# Patient Record
Sex: Female | Born: 1965 | Race: Black or African American | Hispanic: No | Smoking: Current every day smoker
Health system: Southern US, Community
[De-identification: ages and names within clinical notes are randomized; demographics above are authoritative.]

## PROBLEM LIST (undated history)

## (undated) DIAGNOSIS — F319 Bipolar disorder, unspecified: Secondary | ICD-10-CM

## (undated) DIAGNOSIS — F32A Depression, unspecified: Secondary | ICD-10-CM

## (undated) DIAGNOSIS — M549 Dorsalgia, unspecified: Secondary | ICD-10-CM

## (undated) DIAGNOSIS — F329 Major depressive disorder, single episode, unspecified: Secondary | ICD-10-CM

## (undated) DIAGNOSIS — F419 Anxiety disorder, unspecified: Secondary | ICD-10-CM

## (undated) HISTORY — PX: NECK SURGERY: SHX720

---

## 1999-12-07 ENCOUNTER — Other Ambulatory Visit: Admission: RE | Admit: 1999-12-07 | Discharge: 1999-12-07 | Payer: Self-pay | Admitting: Family Medicine

## 2001-05-25 ENCOUNTER — Emergency Department (HOSPITAL_COMMUNITY): Admission: EM | Admit: 2001-05-25 | Discharge: 2001-05-25 | Payer: Self-pay | Admitting: Emergency Medicine

## 2001-07-10 ENCOUNTER — Encounter: Payer: Self-pay | Admitting: Emergency Medicine

## 2001-07-10 ENCOUNTER — Emergency Department (HOSPITAL_COMMUNITY): Admission: EM | Admit: 2001-07-10 | Discharge: 2001-07-10 | Payer: Self-pay | Admitting: Emergency Medicine

## 2001-11-04 ENCOUNTER — Emergency Department (HOSPITAL_COMMUNITY): Admission: EM | Admit: 2001-11-04 | Discharge: 2001-11-04 | Payer: Self-pay

## 2002-07-31 ENCOUNTER — Emergency Department (HOSPITAL_COMMUNITY): Admission: EM | Admit: 2002-07-31 | Discharge: 2002-08-01 | Payer: Self-pay | Admitting: Emergency Medicine

## 2002-08-01 ENCOUNTER — Emergency Department (HOSPITAL_COMMUNITY): Admission: EM | Admit: 2002-08-01 | Discharge: 2002-08-01 | Payer: Self-pay | Admitting: Emergency Medicine

## 2002-12-07 ENCOUNTER — Emergency Department (HOSPITAL_COMMUNITY): Admission: EM | Admit: 2002-12-07 | Discharge: 2002-12-07 | Payer: Self-pay | Admitting: *Deleted

## 2003-07-22 ENCOUNTER — Encounter
Admission: RE | Admit: 2003-07-22 | Discharge: 2003-10-20 | Payer: Self-pay | Admitting: Physical Medicine & Rehabilitation

## 2003-09-20 ENCOUNTER — Emergency Department (HOSPITAL_COMMUNITY): Admission: EM | Admit: 2003-09-20 | Discharge: 2003-09-20 | Payer: Self-pay | Admitting: Emergency Medicine

## 2003-09-29 ENCOUNTER — Emergency Department (HOSPITAL_COMMUNITY): Admission: EM | Admit: 2003-09-29 | Discharge: 2003-09-29 | Payer: Self-pay | Admitting: Emergency Medicine

## 2003-11-03 ENCOUNTER — Observation Stay (HOSPITAL_COMMUNITY): Admission: EM | Admit: 2003-11-03 | Discharge: 2003-11-04 | Payer: Self-pay | Admitting: Internal Medicine

## 2004-01-29 ENCOUNTER — Emergency Department (HOSPITAL_COMMUNITY): Admission: EM | Admit: 2004-01-29 | Discharge: 2004-01-29 | Payer: Self-pay | Admitting: Emergency Medicine

## 2004-02-14 ENCOUNTER — Emergency Department (HOSPITAL_COMMUNITY): Admission: EM | Admit: 2004-02-14 | Discharge: 2004-02-14 | Payer: Self-pay | Admitting: Emergency Medicine

## 2004-02-20 ENCOUNTER — Emergency Department (HOSPITAL_COMMUNITY): Admission: EM | Admit: 2004-02-20 | Discharge: 2004-02-21 | Payer: Self-pay | Admitting: Emergency Medicine

## 2004-02-23 ENCOUNTER — Emergency Department (HOSPITAL_COMMUNITY): Admission: EM | Admit: 2004-02-23 | Discharge: 2004-02-23 | Payer: Self-pay | Admitting: Emergency Medicine

## 2004-03-15 ENCOUNTER — Encounter: Admission: RE | Admit: 2004-03-15 | Discharge: 2004-03-15 | Payer: Self-pay | Admitting: Orthopedic Surgery

## 2004-03-19 ENCOUNTER — Emergency Department (HOSPITAL_COMMUNITY): Admission: EM | Admit: 2004-03-19 | Discharge: 2004-03-19 | Payer: Self-pay | Admitting: Emergency Medicine

## 2004-03-23 ENCOUNTER — Emergency Department (HOSPITAL_COMMUNITY): Admission: EM | Admit: 2004-03-23 | Discharge: 2004-03-24 | Payer: Self-pay | Admitting: Emergency Medicine

## 2004-04-02 ENCOUNTER — Emergency Department (HOSPITAL_COMMUNITY): Admission: EM | Admit: 2004-04-02 | Discharge: 2004-04-02 | Payer: Self-pay | Admitting: Emergency Medicine

## 2004-04-21 ENCOUNTER — Emergency Department (HOSPITAL_COMMUNITY): Admission: EM | Admit: 2004-04-21 | Discharge: 2004-04-21 | Payer: Self-pay | Admitting: Emergency Medicine

## 2004-04-23 ENCOUNTER — Emergency Department (HOSPITAL_COMMUNITY): Admission: EM | Admit: 2004-04-23 | Discharge: 2004-04-23 | Payer: Self-pay | Admitting: Emergency Medicine

## 2004-04-28 ENCOUNTER — Emergency Department (HOSPITAL_COMMUNITY): Admission: EM | Admit: 2004-04-28 | Discharge: 2004-04-28 | Payer: Self-pay

## 2004-05-02 ENCOUNTER — Emergency Department (HOSPITAL_COMMUNITY): Admission: EM | Admit: 2004-05-02 | Discharge: 2004-05-02 | Payer: Self-pay | Admitting: Emergency Medicine

## 2004-05-19 ENCOUNTER — Emergency Department (HOSPITAL_COMMUNITY): Admission: EM | Admit: 2004-05-19 | Discharge: 2004-05-19 | Payer: Self-pay | Admitting: Emergency Medicine

## 2004-05-22 ENCOUNTER — Emergency Department (HOSPITAL_COMMUNITY): Admission: EM | Admit: 2004-05-22 | Discharge: 2004-05-22 | Payer: Self-pay

## 2004-05-23 IMAGING — CR DG CHEST 2V
2 series · 2 of 2 positions shown · non-contrast
Comparison: none

CLINICAL DATA: Chest wall pain.  History of asthma.
 CHEST X-RAY
 Two views of the chest are compared to a chest x-ray of 02/23/04.  The lungs are not as well aerated and there is basilar atelectasis.  In addition, there is an opacity at the mid right lung base which is new.  Pneumonia is a definite consideration and follow up chest x-ray to insure clearing is recommended.  On the lateral view, this opacity most likely is in the right lower lobe posteriorly. 
 IMPRESSION
 New opacity at the right lung base, probably in the right lower lobe, most consistent with pneumonia.  Basilar atelectasis.

[view not recorded (1 of 2)]
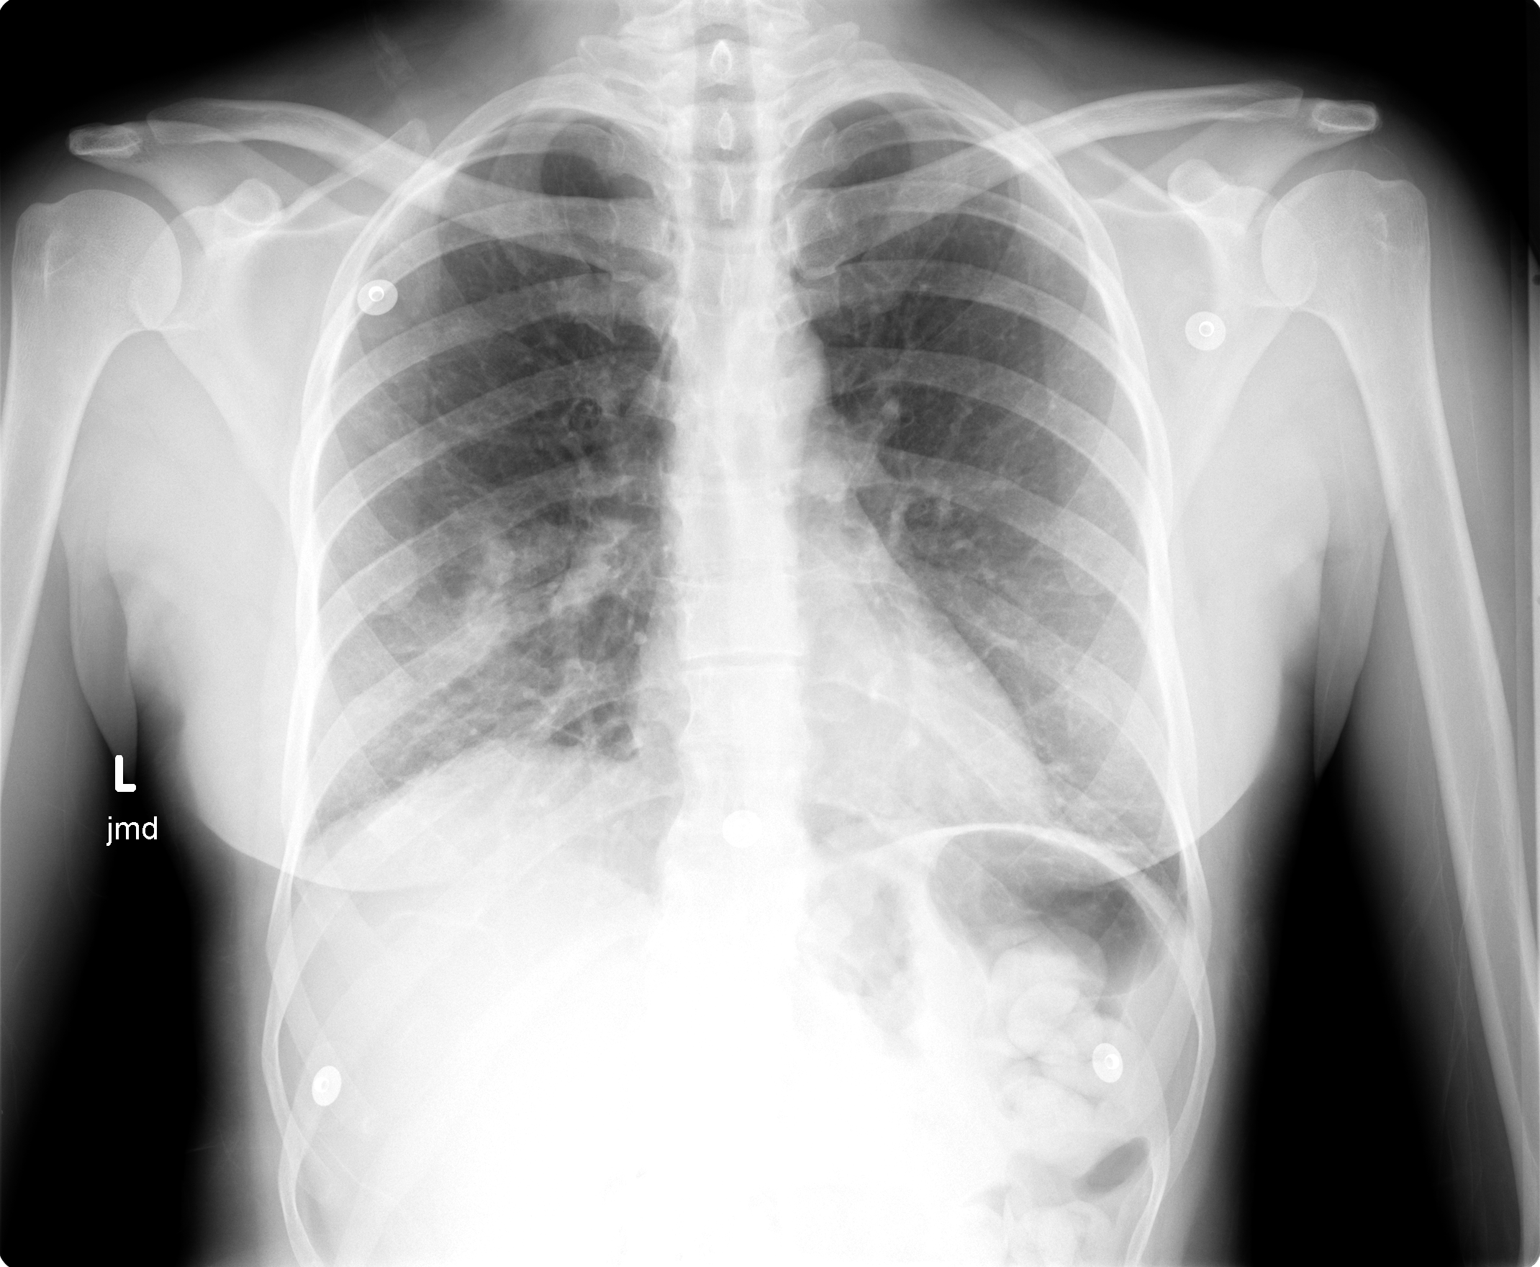

[view not recorded (2 of 2)]
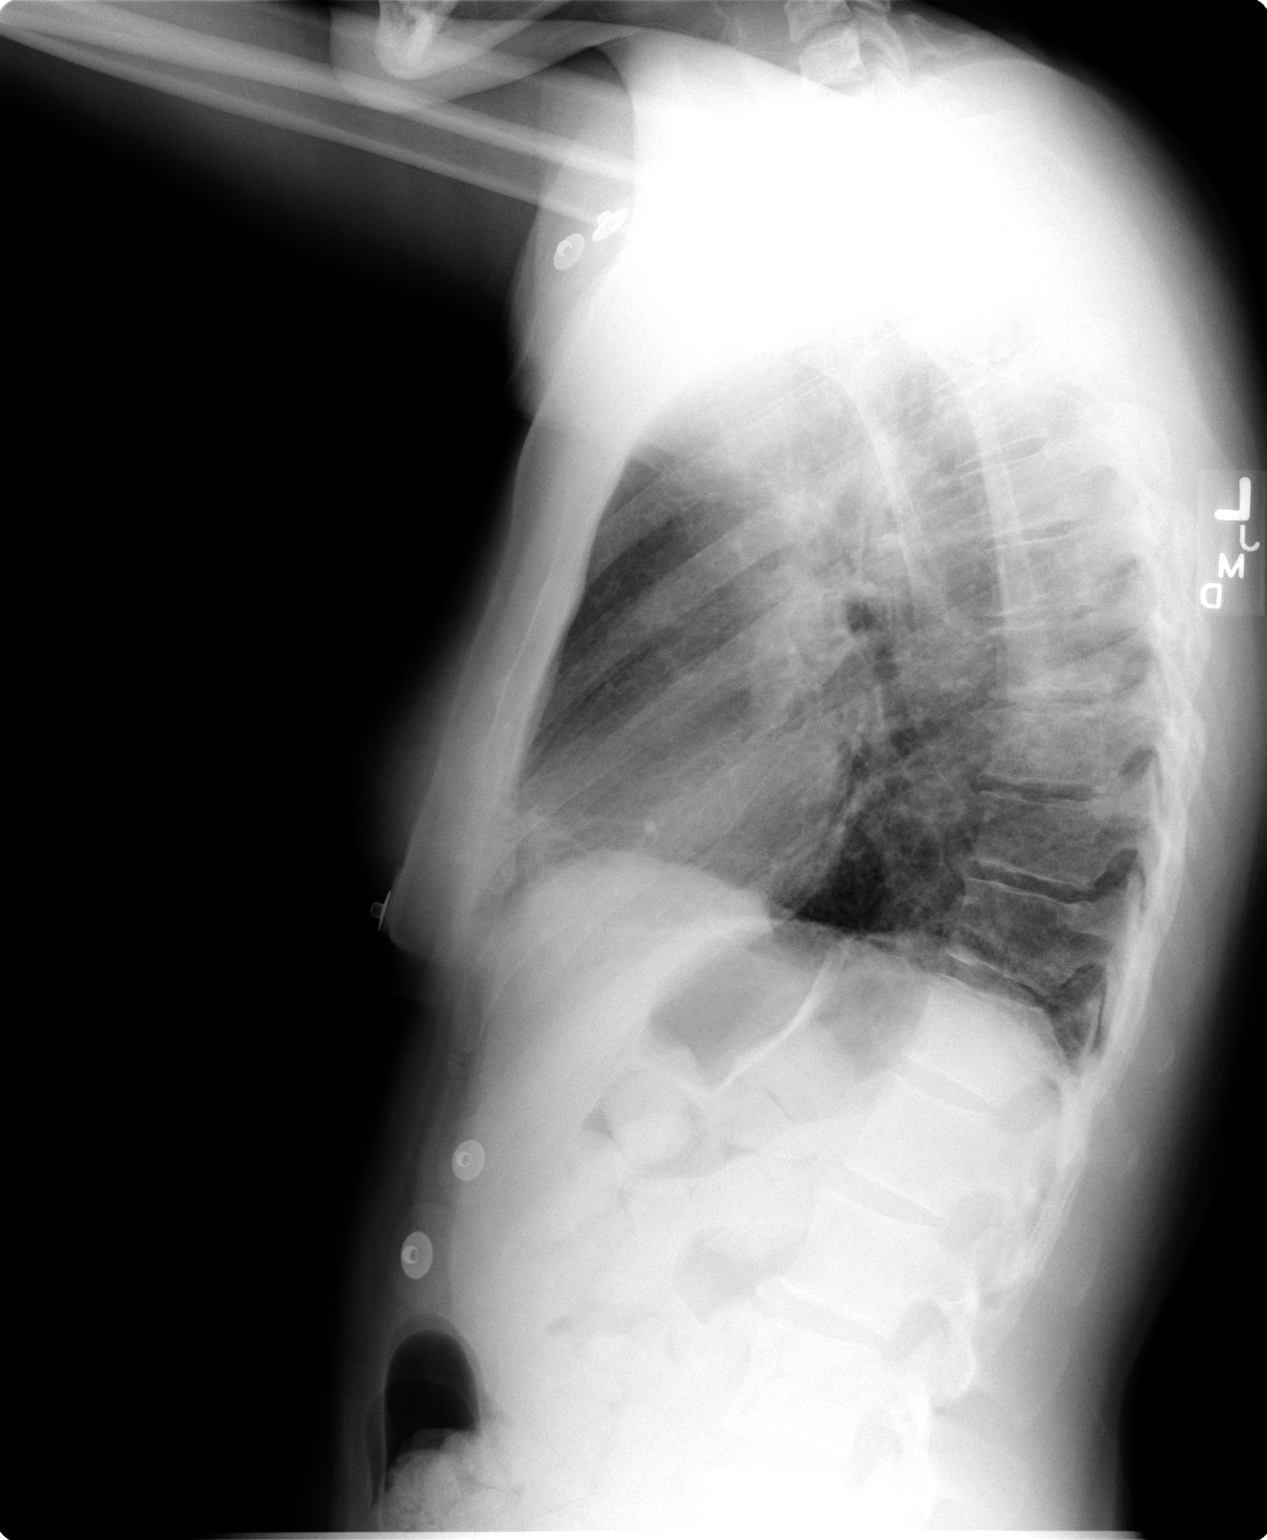

[2 of 2 positions shown; findings below may reference images not displayed]

## 2004-06-23 ENCOUNTER — Emergency Department (HOSPITAL_COMMUNITY): Admission: EM | Admit: 2004-06-23 | Discharge: 2004-06-23 | Payer: Self-pay | Admitting: Emergency Medicine

## 2004-06-27 ENCOUNTER — Emergency Department (HOSPITAL_COMMUNITY): Admission: EM | Admit: 2004-06-27 | Discharge: 2004-06-27 | Payer: Self-pay | Admitting: Emergency Medicine

## 2004-07-10 ENCOUNTER — Emergency Department (HOSPITAL_COMMUNITY): Admission: EM | Admit: 2004-07-10 | Discharge: 2004-07-10 | Payer: Self-pay | Admitting: Emergency Medicine

## 2004-07-19 ENCOUNTER — Emergency Department (HOSPITAL_COMMUNITY): Admission: EM | Admit: 2004-07-19 | Discharge: 2004-07-19 | Payer: Self-pay | Admitting: Emergency Medicine

## 2004-08-10 ENCOUNTER — Emergency Department (HOSPITAL_COMMUNITY): Admission: EM | Admit: 2004-08-10 | Discharge: 2004-08-10 | Payer: Self-pay | Admitting: Emergency Medicine

## 2004-08-12 ENCOUNTER — Emergency Department (HOSPITAL_COMMUNITY): Admission: EM | Admit: 2004-08-12 | Discharge: 2004-08-12 | Payer: Self-pay | Admitting: Emergency Medicine

## 2004-08-16 ENCOUNTER — Emergency Department (HOSPITAL_COMMUNITY): Admission: EM | Admit: 2004-08-16 | Discharge: 2004-08-16 | Payer: Self-pay | Admitting: Emergency Medicine

## 2004-08-29 ENCOUNTER — Emergency Department (HOSPITAL_COMMUNITY): Admission: EM | Admit: 2004-08-29 | Discharge: 2004-08-29 | Payer: Self-pay | Admitting: Family Medicine

## 2004-09-07 ENCOUNTER — Emergency Department (HOSPITAL_COMMUNITY): Admission: EM | Admit: 2004-09-07 | Discharge: 2004-09-07 | Payer: Self-pay | Admitting: Family Medicine

## 2004-09-23 ENCOUNTER — Emergency Department (HOSPITAL_COMMUNITY): Admission: EM | Admit: 2004-09-23 | Discharge: 2004-09-23 | Payer: Self-pay | Admitting: Family Medicine

## 2004-10-04 ENCOUNTER — Emergency Department (HOSPITAL_COMMUNITY): Admission: EM | Admit: 2004-10-04 | Discharge: 2004-10-04 | Payer: Self-pay | Admitting: Family Medicine

## 2004-10-17 ENCOUNTER — Emergency Department (HOSPITAL_COMMUNITY): Admission: EM | Admit: 2004-10-17 | Discharge: 2004-10-17 | Payer: Self-pay | Admitting: Emergency Medicine

## 2004-10-21 ENCOUNTER — Ambulatory Visit: Payer: Self-pay | Admitting: Internal Medicine

## 2004-11-08 ENCOUNTER — Emergency Department (HOSPITAL_COMMUNITY): Admission: EM | Admit: 2004-11-08 | Discharge: 2004-11-08 | Payer: Self-pay | Admitting: Emergency Medicine

## 2005-01-05 ENCOUNTER — Emergency Department (HOSPITAL_COMMUNITY): Admission: EM | Admit: 2005-01-05 | Discharge: 2005-01-05 | Payer: Self-pay | Admitting: Family Medicine

## 2005-02-02 ENCOUNTER — Emergency Department (HOSPITAL_COMMUNITY): Admission: EM | Admit: 2005-02-02 | Discharge: 2005-02-02 | Payer: Self-pay | Admitting: Emergency Medicine

## 2005-03-06 ENCOUNTER — Emergency Department (HOSPITAL_COMMUNITY): Admission: EM | Admit: 2005-03-06 | Discharge: 2005-03-06 | Payer: Self-pay | Admitting: Emergency Medicine

## 2005-03-27 ENCOUNTER — Emergency Department (HOSPITAL_COMMUNITY): Admission: EM | Admit: 2005-03-27 | Discharge: 2005-03-27 | Payer: Self-pay | Admitting: Emergency Medicine

## 2005-05-05 ENCOUNTER — Emergency Department (HOSPITAL_COMMUNITY): Admission: EM | Admit: 2005-05-05 | Discharge: 2005-05-05 | Payer: Self-pay | Admitting: Emergency Medicine

## 2005-06-15 ENCOUNTER — Ambulatory Visit: Payer: Self-pay | Admitting: Internal Medicine

## 2005-07-04 ENCOUNTER — Ambulatory Visit: Payer: Self-pay | Admitting: Internal Medicine

## 2005-07-04 ENCOUNTER — Other Ambulatory Visit: Admission: RE | Admit: 2005-07-04 | Discharge: 2005-07-04 | Payer: Self-pay | Admitting: Internal Medicine

## 2005-09-06 ENCOUNTER — Emergency Department (HOSPITAL_COMMUNITY): Admission: EM | Admit: 2005-09-06 | Discharge: 2005-09-06 | Payer: Self-pay | Admitting: Emergency Medicine

## 2005-12-05 ENCOUNTER — Emergency Department (HOSPITAL_COMMUNITY): Admission: EM | Admit: 2005-12-05 | Discharge: 2005-12-05 | Payer: Self-pay | Admitting: Emergency Medicine

## 2006-03-22 ENCOUNTER — Emergency Department (HOSPITAL_COMMUNITY): Admission: EM | Admit: 2006-03-22 | Discharge: 2006-03-22 | Payer: Self-pay | Admitting: Family Medicine

## 2006-05-14 ENCOUNTER — Emergency Department (HOSPITAL_COMMUNITY): Admission: EM | Admit: 2006-05-14 | Discharge: 2006-05-14 | Payer: Self-pay | Admitting: Family Medicine

## 2006-05-15 ENCOUNTER — Emergency Department (HOSPITAL_COMMUNITY): Admission: EM | Admit: 2006-05-15 | Discharge: 2006-05-16 | Payer: Self-pay | Admitting: Emergency Medicine

## 2006-05-16 ENCOUNTER — Inpatient Hospital Stay (HOSPITAL_COMMUNITY): Admission: EM | Admit: 2006-05-16 | Discharge: 2006-05-28 | Payer: Self-pay | Admitting: Emergency Medicine

## 2006-06-04 ENCOUNTER — Emergency Department (HOSPITAL_COMMUNITY): Admission: EM | Admit: 2006-06-04 | Discharge: 2006-06-04 | Payer: Self-pay | Admitting: Emergency Medicine

## 2006-06-23 ENCOUNTER — Emergency Department (HOSPITAL_COMMUNITY): Admission: EM | Admit: 2006-06-23 | Discharge: 2006-06-23 | Payer: Self-pay | Admitting: Family Medicine

## 2006-07-24 ENCOUNTER — Emergency Department (HOSPITAL_COMMUNITY): Admission: EM | Admit: 2006-07-24 | Discharge: 2006-07-24 | Payer: Self-pay | Admitting: Family Medicine

## 2006-07-25 ENCOUNTER — Emergency Department (HOSPITAL_COMMUNITY): Admission: EM | Admit: 2006-07-25 | Discharge: 2006-07-25 | Payer: Self-pay | Admitting: Family Medicine

## 2006-08-22 ENCOUNTER — Emergency Department (HOSPITAL_COMMUNITY): Admission: EM | Admit: 2006-08-22 | Discharge: 2006-08-22 | Payer: Self-pay | Admitting: Emergency Medicine

## 2006-08-22 ENCOUNTER — Ambulatory Visit: Payer: Self-pay | Admitting: Family Medicine

## 2006-10-03 ENCOUNTER — Other Ambulatory Visit: Admission: RE | Admit: 2006-10-03 | Discharge: 2006-10-03 | Payer: Self-pay | Admitting: Family Medicine

## 2006-10-03 ENCOUNTER — Ambulatory Visit: Payer: Self-pay | Admitting: Family Medicine

## 2006-11-02 ENCOUNTER — Ambulatory Visit: Payer: Self-pay | Admitting: Family Medicine

## 2006-11-07 ENCOUNTER — Encounter: Admission: RE | Admit: 2006-11-07 | Discharge: 2006-11-07 | Payer: Self-pay | Admitting: Internal Medicine

## 2006-11-16 ENCOUNTER — Ambulatory Visit: Payer: Self-pay | Admitting: Sports Medicine

## 2006-11-20 ENCOUNTER — Emergency Department (HOSPITAL_COMMUNITY): Admission: EM | Admit: 2006-11-20 | Discharge: 2006-11-20 | Payer: Self-pay | Admitting: Emergency Medicine

## 2006-11-24 ENCOUNTER — Emergency Department (HOSPITAL_COMMUNITY): Admission: EM | Admit: 2006-11-24 | Discharge: 2006-11-24 | Payer: Self-pay | Admitting: Emergency Medicine

## 2006-11-26 ENCOUNTER — Emergency Department (HOSPITAL_COMMUNITY): Admission: EM | Admit: 2006-11-26 | Discharge: 2006-11-26 | Payer: Self-pay | Admitting: Emergency Medicine

## 2006-12-21 ENCOUNTER — Encounter: Admission: RE | Admit: 2006-12-21 | Discharge: 2006-12-21 | Payer: Self-pay | Admitting: Emergency Medicine

## 2007-01-02 ENCOUNTER — Ambulatory Visit: Payer: Self-pay | Admitting: Family Medicine

## 2007-01-31 DIAGNOSIS — G43909 Migraine, unspecified, not intractable, without status migrainosus: Secondary | ICD-10-CM

## 2007-02-13 ENCOUNTER — Emergency Department (HOSPITAL_COMMUNITY): Admission: EM | Admit: 2007-02-13 | Discharge: 2007-02-13 | Payer: Self-pay | Admitting: Emergency Medicine

## 2007-03-01 ENCOUNTER — Emergency Department (HOSPITAL_COMMUNITY): Admission: EM | Admit: 2007-03-01 | Discharge: 2007-03-01 | Payer: Self-pay | Admitting: Family Medicine

## 2007-03-01 ENCOUNTER — Telehealth: Payer: Self-pay | Admitting: *Deleted

## 2007-05-17 ENCOUNTER — Ambulatory Visit: Admission: AD | Admit: 2007-05-17 | Discharge: 2007-05-17 | Payer: Self-pay | Admitting: Obstetrics & Gynecology

## 2007-05-17 ENCOUNTER — Encounter (INDEPENDENT_AMBULATORY_CARE_PROVIDER_SITE_OTHER): Payer: Self-pay | Admitting: Obstetrics and Gynecology

## 2007-07-14 ENCOUNTER — Emergency Department (HOSPITAL_COMMUNITY): Admission: EM | Admit: 2007-07-14 | Discharge: 2007-07-14 | Payer: Self-pay | Admitting: Emergency Medicine

## 2007-07-15 ENCOUNTER — Emergency Department (HOSPITAL_COMMUNITY): Admission: EM | Admit: 2007-07-15 | Discharge: 2007-07-16 | Payer: Self-pay | Admitting: Emergency Medicine

## 2007-07-18 ENCOUNTER — Emergency Department (HOSPITAL_COMMUNITY): Admission: EM | Admit: 2007-07-18 | Discharge: 2007-07-18 | Payer: Self-pay | Admitting: Emergency Medicine

## 2007-07-20 ENCOUNTER — Emergency Department (HOSPITAL_COMMUNITY): Admission: EM | Admit: 2007-07-20 | Discharge: 2007-07-20 | Payer: Self-pay | Admitting: Emergency Medicine

## 2007-10-14 ENCOUNTER — Emergency Department (HOSPITAL_COMMUNITY): Admission: EM | Admit: 2007-10-14 | Discharge: 2007-10-14 | Payer: Self-pay | Admitting: Emergency Medicine

## 2007-10-22 ENCOUNTER — Telehealth (INDEPENDENT_AMBULATORY_CARE_PROVIDER_SITE_OTHER): Payer: Self-pay | Admitting: Family Medicine

## 2007-10-28 ENCOUNTER — Ambulatory Visit: Payer: Self-pay | Admitting: Family Medicine

## 2007-11-15 ENCOUNTER — Inpatient Hospital Stay (HOSPITAL_COMMUNITY): Admission: RE | Admit: 2007-11-15 | Discharge: 2007-11-17 | Payer: Self-pay | Admitting: Neurosurgery

## 2007-11-18 ENCOUNTER — Inpatient Hospital Stay (HOSPITAL_COMMUNITY): Admission: EM | Admit: 2007-11-18 | Discharge: 2007-11-20 | Payer: Self-pay | Admitting: Emergency Medicine

## 2007-12-02 ENCOUNTER — Encounter (INDEPENDENT_AMBULATORY_CARE_PROVIDER_SITE_OTHER): Payer: Self-pay | Admitting: Family Medicine

## 2007-12-04 ENCOUNTER — Encounter: Payer: Self-pay | Admitting: *Deleted

## 2007-12-04 ENCOUNTER — Ambulatory Visit: Payer: Self-pay | Admitting: Family Medicine

## 2007-12-04 ENCOUNTER — Encounter (INDEPENDENT_AMBULATORY_CARE_PROVIDER_SITE_OTHER): Payer: Self-pay | Admitting: Family Medicine

## 2007-12-04 LAB — CONVERTED CEMR LAB
Glucose, Urine, Semiquant: NEGATIVE
Nitrite: NEGATIVE
Specific Gravity, Urine: 1.015
pH: 7

## 2007-12-11 ENCOUNTER — Encounter (INDEPENDENT_AMBULATORY_CARE_PROVIDER_SITE_OTHER): Payer: Self-pay | Admitting: Family Medicine

## 2007-12-11 ENCOUNTER — Other Ambulatory Visit: Admission: RE | Admit: 2007-12-11 | Discharge: 2007-12-11 | Payer: Self-pay | Admitting: Family Medicine

## 2007-12-11 ENCOUNTER — Ambulatory Visit: Payer: Self-pay | Admitting: Family Medicine

## 2007-12-11 DIAGNOSIS — Z716 Tobacco abuse counseling: Secondary | ICD-10-CM

## 2007-12-11 LAB — CONVERTED CEMR LAB
Chlamydia, DNA Probe: NEGATIVE
GC Probe Amp, Genital: NEGATIVE
Hemoglobin: 12 g/dL (ref 12.0–15.0)
Pap Smear: NORMAL
RBC: 4.72 M/uL (ref 3.87–5.11)
WBC: 9.6 10*3/uL (ref 4.0–10.5)
Whiff Test: NEGATIVE

## 2007-12-17 ENCOUNTER — Encounter: Admission: RE | Admit: 2007-12-17 | Discharge: 2007-12-17 | Payer: Self-pay | Admitting: Family Medicine

## 2008-01-15 ENCOUNTER — Encounter (INDEPENDENT_AMBULATORY_CARE_PROVIDER_SITE_OTHER): Payer: Self-pay | Admitting: Family Medicine

## 2008-01-17 ENCOUNTER — Ambulatory Visit: Payer: Self-pay | Admitting: Family Medicine

## 2008-02-12 ENCOUNTER — Encounter (INDEPENDENT_AMBULATORY_CARE_PROVIDER_SITE_OTHER): Payer: Self-pay | Admitting: Family Medicine

## 2008-02-27 ENCOUNTER — Emergency Department (HOSPITAL_COMMUNITY): Admission: EM | Admit: 2008-02-27 | Discharge: 2008-02-27 | Payer: Self-pay | Admitting: Emergency Medicine

## 2008-03-10 ENCOUNTER — Ambulatory Visit: Payer: Self-pay | Admitting: Family Medicine

## 2008-03-10 LAB — CONVERTED CEMR LAB: Beta hcg, urine, semiquantitative: NEGATIVE

## 2008-03-24 ENCOUNTER — Ambulatory Visit: Payer: Self-pay | Admitting: Family Medicine

## 2008-03-24 LAB — CONVERTED CEMR LAB: Beta hcg, urine, semiquantitative: NEGATIVE

## 2008-03-30 ENCOUNTER — Encounter: Admission: RE | Admit: 2008-03-30 | Discharge: 2008-03-30 | Payer: Self-pay | Admitting: Neurosurgery

## 2008-04-09 ENCOUNTER — Telehealth: Payer: Self-pay | Admitting: *Deleted

## 2008-04-13 ENCOUNTER — Ambulatory Visit: Payer: Self-pay | Admitting: Family Medicine

## 2008-04-13 ENCOUNTER — Encounter: Payer: Self-pay | Admitting: *Deleted

## 2008-04-13 ENCOUNTER — Encounter (INDEPENDENT_AMBULATORY_CARE_PROVIDER_SITE_OTHER): Payer: Self-pay | Admitting: Family Medicine

## 2008-04-13 ENCOUNTER — Encounter: Payer: Self-pay | Admitting: Family Medicine

## 2008-04-13 ENCOUNTER — Observation Stay (HOSPITAL_COMMUNITY): Admission: AD | Admit: 2008-04-13 | Discharge: 2008-04-14 | Payer: Self-pay | Admitting: Family Medicine

## 2008-04-14 ENCOUNTER — Ambulatory Visit: Payer: Self-pay | Admitting: Psychiatry

## 2008-04-28 ENCOUNTER — Telehealth: Payer: Self-pay | Admitting: *Deleted

## 2008-05-08 ENCOUNTER — Telehealth: Payer: Self-pay | Admitting: *Deleted

## 2008-05-08 ENCOUNTER — Encounter (INDEPENDENT_AMBULATORY_CARE_PROVIDER_SITE_OTHER): Payer: Self-pay | Admitting: Family Medicine

## 2008-05-11 ENCOUNTER — Ambulatory Visit: Payer: Self-pay | Admitting: Sports Medicine

## 2008-05-14 ENCOUNTER — Telehealth: Payer: Self-pay | Admitting: *Deleted

## 2008-05-18 ENCOUNTER — Emergency Department (HOSPITAL_COMMUNITY): Admission: EM | Admit: 2008-05-18 | Discharge: 2008-05-18 | Payer: Self-pay | Admitting: Family Medicine

## 2008-05-20 ENCOUNTER — Ambulatory Visit: Payer: Self-pay | Admitting: Family Medicine

## 2008-05-20 DIAGNOSIS — G8921 Chronic pain due to trauma: Secondary | ICD-10-CM | POA: Insufficient documentation

## 2008-05-26 ENCOUNTER — Telehealth: Payer: Self-pay | Admitting: *Deleted

## 2008-06-12 ENCOUNTER — Ambulatory Visit: Payer: Self-pay | Admitting: Family Medicine

## 2008-06-30 ENCOUNTER — Emergency Department (HOSPITAL_COMMUNITY): Admission: EM | Admit: 2008-06-30 | Discharge: 2008-06-30 | Payer: Self-pay | Admitting: Emergency Medicine

## 2008-09-04 ENCOUNTER — Telehealth (INDEPENDENT_AMBULATORY_CARE_PROVIDER_SITE_OTHER): Payer: Self-pay | Admitting: Family Medicine

## 2008-09-04 ENCOUNTER — Emergency Department (HOSPITAL_COMMUNITY): Admission: EM | Admit: 2008-09-04 | Discharge: 2008-09-04 | Payer: Self-pay | Admitting: Family Medicine

## 2008-09-11 ENCOUNTER — Encounter: Payer: Self-pay | Admitting: *Deleted

## 2008-09-16 ENCOUNTER — Emergency Department (HOSPITAL_COMMUNITY): Admission: EM | Admit: 2008-09-16 | Discharge: 2008-09-16 | Payer: Self-pay | Admitting: Emergency Medicine

## 2008-09-30 ENCOUNTER — Emergency Department (HOSPITAL_COMMUNITY): Admission: EM | Admit: 2008-09-30 | Discharge: 2008-09-30 | Payer: Self-pay | Admitting: Emergency Medicine

## 2008-10-19 ENCOUNTER — Telehealth (INDEPENDENT_AMBULATORY_CARE_PROVIDER_SITE_OTHER): Payer: Self-pay | Admitting: *Deleted

## 2008-10-21 ENCOUNTER — Encounter (INDEPENDENT_AMBULATORY_CARE_PROVIDER_SITE_OTHER): Payer: Self-pay | Admitting: *Deleted

## 2008-11-10 ENCOUNTER — Ambulatory Visit: Payer: Self-pay | Admitting: Family Medicine

## 2008-11-10 DIAGNOSIS — K219 Gastro-esophageal reflux disease without esophagitis: Secondary | ICD-10-CM | POA: Insufficient documentation

## 2008-11-24 ENCOUNTER — Emergency Department (HOSPITAL_COMMUNITY): Admission: EM | Admit: 2008-11-24 | Discharge: 2008-11-24 | Payer: Self-pay | Admitting: Emergency Medicine

## 2008-12-14 ENCOUNTER — Emergency Department (HOSPITAL_COMMUNITY): Admission: EM | Admit: 2008-12-14 | Discharge: 2008-12-14 | Payer: Self-pay | Admitting: Family Medicine

## 2008-12-16 ENCOUNTER — Encounter (INDEPENDENT_AMBULATORY_CARE_PROVIDER_SITE_OTHER): Payer: Self-pay | Admitting: Family Medicine

## 2008-12-16 ENCOUNTER — Ambulatory Visit: Payer: Self-pay | Admitting: Family Medicine

## 2008-12-16 ENCOUNTER — Inpatient Hospital Stay (HOSPITAL_COMMUNITY): Admission: AD | Admit: 2008-12-16 | Discharge: 2008-12-24 | Payer: Self-pay | Admitting: Family Medicine

## 2008-12-16 ENCOUNTER — Telehealth (INDEPENDENT_AMBULATORY_CARE_PROVIDER_SITE_OTHER): Payer: Self-pay | Admitting: *Deleted

## 2008-12-18 ENCOUNTER — Encounter (INDEPENDENT_AMBULATORY_CARE_PROVIDER_SITE_OTHER): Payer: Self-pay | Admitting: Neurology

## 2008-12-18 ENCOUNTER — Encounter: Payer: Self-pay | Admitting: Family Medicine

## 2008-12-24 ENCOUNTER — Ambulatory Visit: Payer: Self-pay | Admitting: Psychiatry

## 2008-12-24 ENCOUNTER — Inpatient Hospital Stay (HOSPITAL_COMMUNITY): Admission: AD | Admit: 2008-12-24 | Discharge: 2008-12-29 | Payer: Self-pay | Admitting: Psychiatry

## 2009-01-05 ENCOUNTER — Emergency Department (HOSPITAL_COMMUNITY): Admission: EM | Admit: 2009-01-05 | Discharge: 2009-01-05 | Payer: Self-pay | Admitting: Family Medicine

## 2009-01-05 ENCOUNTER — Emergency Department (HOSPITAL_COMMUNITY): Admission: EM | Admit: 2009-01-05 | Discharge: 2009-01-05 | Payer: Self-pay | Admitting: Emergency Medicine

## 2009-01-06 ENCOUNTER — Telehealth (INDEPENDENT_AMBULATORY_CARE_PROVIDER_SITE_OTHER): Payer: Self-pay | Admitting: Family Medicine

## 2009-01-06 ENCOUNTER — Ambulatory Visit: Payer: Self-pay | Admitting: Family Medicine

## 2009-01-21 ENCOUNTER — Encounter (INDEPENDENT_AMBULATORY_CARE_PROVIDER_SITE_OTHER): Payer: Self-pay | Admitting: Family Medicine

## 2009-01-25 ENCOUNTER — Encounter (INDEPENDENT_AMBULATORY_CARE_PROVIDER_SITE_OTHER): Payer: Self-pay | Admitting: Family Medicine

## 2009-01-25 ENCOUNTER — Ambulatory Visit: Payer: Self-pay | Admitting: Family Medicine

## 2009-01-25 LAB — CONVERTED CEMR LAB: Rapid Strep: NEGATIVE

## 2009-02-04 ENCOUNTER — Inpatient Hospital Stay (HOSPITAL_COMMUNITY): Admission: AD | Admit: 2009-02-04 | Discharge: 2009-02-06 | Payer: Self-pay | Admitting: *Deleted

## 2009-02-04 ENCOUNTER — Ambulatory Visit: Payer: Self-pay | Admitting: *Deleted

## 2009-02-27 ENCOUNTER — Emergency Department (HOSPITAL_COMMUNITY): Admission: EM | Admit: 2009-02-27 | Discharge: 2009-02-27 | Payer: Self-pay | Admitting: Family Medicine

## 2009-03-04 ENCOUNTER — Emergency Department (HOSPITAL_COMMUNITY): Admission: EM | Admit: 2009-03-04 | Discharge: 2009-03-04 | Payer: Self-pay | Admitting: Family Medicine

## 2009-03-09 ENCOUNTER — Encounter (INDEPENDENT_AMBULATORY_CARE_PROVIDER_SITE_OTHER): Payer: Self-pay | Admitting: Family Medicine

## 2009-03-11 ENCOUNTER — Encounter (INDEPENDENT_AMBULATORY_CARE_PROVIDER_SITE_OTHER): Payer: Self-pay | Admitting: Family Medicine

## 2009-03-11 ENCOUNTER — Ambulatory Visit: Payer: Self-pay | Admitting: Family Medicine

## 2009-03-11 DIAGNOSIS — H469 Unspecified optic neuritis: Secondary | ICD-10-CM | POA: Insufficient documentation

## 2009-03-11 DIAGNOSIS — R7301 Impaired fasting glucose: Secondary | ICD-10-CM

## 2009-03-11 DIAGNOSIS — E785 Hyperlipidemia, unspecified: Secondary | ICD-10-CM

## 2009-03-11 LAB — CONVERTED CEMR LAB: Hgb A1c MFr Bld: 6.2 %

## 2009-03-12 LAB — CONVERTED CEMR LAB
ALT: 17 units/L (ref 0–35)
AST: 20 units/L (ref 0–37)
BUN: 7 mg/dL (ref 6–23)
Calcium: 10 mg/dL (ref 8.4–10.5)
Chloride: 101 meq/L (ref 96–112)
Creatinine, Ser: 0.95 mg/dL (ref 0.40–1.20)
Total Bilirubin: 0.3 mg/dL (ref 0.3–1.2)

## 2009-03-15 ENCOUNTER — Other Ambulatory Visit: Payer: Self-pay | Admitting: Obstetrics & Gynecology

## 2009-03-15 ENCOUNTER — Emergency Department (HOSPITAL_COMMUNITY): Admission: EM | Admit: 2009-03-15 | Discharge: 2009-03-15 | Payer: Self-pay | Admitting: Emergency Medicine

## 2009-03-18 ENCOUNTER — Emergency Department (HOSPITAL_COMMUNITY): Admission: EM | Admit: 2009-03-18 | Discharge: 2009-03-18 | Payer: Self-pay | Admitting: Emergency Medicine

## 2009-03-19 ENCOUNTER — Emergency Department (HOSPITAL_COMMUNITY): Admission: EM | Admit: 2009-03-19 | Discharge: 2009-03-20 | Payer: Self-pay | Admitting: Emergency Medicine

## 2009-03-26 ENCOUNTER — Emergency Department (HOSPITAL_COMMUNITY): Admission: EM | Admit: 2009-03-26 | Discharge: 2009-03-26 | Payer: Self-pay | Admitting: Family Medicine

## 2009-03-29 ENCOUNTER — Emergency Department (HOSPITAL_COMMUNITY): Admission: EM | Admit: 2009-03-29 | Discharge: 2009-03-29 | Payer: Self-pay | Admitting: Emergency Medicine

## 2009-03-31 ENCOUNTER — Ambulatory Visit: Payer: Self-pay | Admitting: Family Medicine

## 2009-04-07 ENCOUNTER — Encounter (INDEPENDENT_AMBULATORY_CARE_PROVIDER_SITE_OTHER): Payer: Self-pay | Admitting: Family Medicine

## 2009-04-11 ENCOUNTER — Emergency Department (HOSPITAL_COMMUNITY): Admission: EM | Admit: 2009-04-11 | Discharge: 2009-04-11 | Payer: Self-pay | Admitting: Family Medicine

## 2009-04-30 ENCOUNTER — Emergency Department (HOSPITAL_COMMUNITY): Admission: EM | Admit: 2009-04-30 | Discharge: 2009-04-30 | Payer: Self-pay | Admitting: Emergency Medicine

## 2009-05-02 ENCOUNTER — Emergency Department (HOSPITAL_COMMUNITY): Admission: EM | Admit: 2009-05-02 | Discharge: 2009-05-07 | Payer: Self-pay | Admitting: Emergency Medicine

## 2009-05-26 ENCOUNTER — Emergency Department (HOSPITAL_COMMUNITY): Admission: EM | Admit: 2009-05-26 | Discharge: 2009-05-27 | Payer: Self-pay | Admitting: Emergency Medicine

## 2009-06-02 ENCOUNTER — Emergency Department (HOSPITAL_COMMUNITY): Admission: EM | Admit: 2009-06-02 | Discharge: 2009-06-02 | Payer: Self-pay | Admitting: Emergency Medicine

## 2009-06-02 ENCOUNTER — Encounter (INDEPENDENT_AMBULATORY_CARE_PROVIDER_SITE_OTHER): Payer: Self-pay | Admitting: Family Medicine

## 2009-06-04 ENCOUNTER — Emergency Department (HOSPITAL_COMMUNITY): Admission: EM | Admit: 2009-06-04 | Discharge: 2009-06-04 | Payer: Self-pay | Admitting: Emergency Medicine

## 2009-06-23 IMAGING — CR DG CHEST 1V PORT
1 series · 1 of 1 positions shown · non-contrast
Comparison: 11/15/2007

CLINICAL DATA: Medical clearance

PORTABLE CHEST - 1 VIEW

[view not recorded]
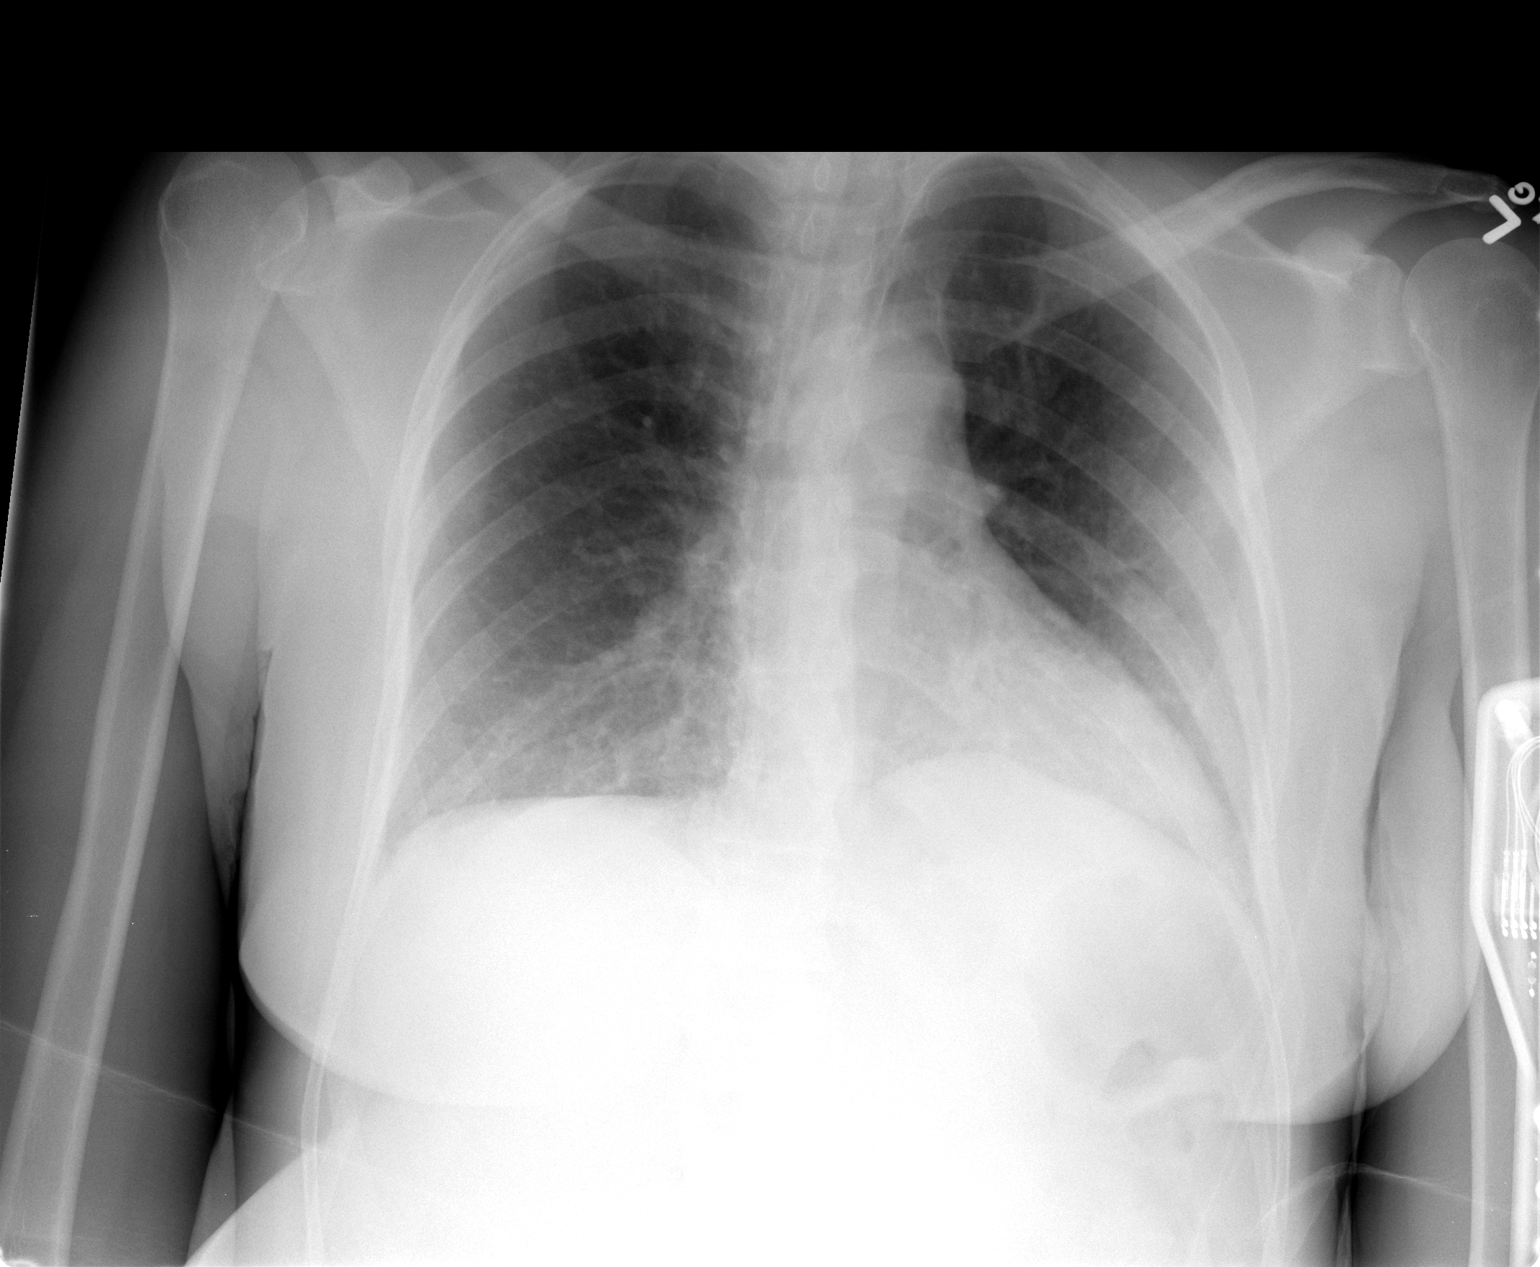

[1 of 1 positions shown; findings below may reference images not displayed]

FINDINGS: Low inspiratory phase film.  Interstitial densities are
chronic.  The heart and mediastinal contours are normal.
IMPRESSION: Stable chest.

## 2009-06-27 IMAGING — CR DG TOE GREAT 2+V*R*
3 series · 3 of 3 positions shown · non-contrast
Comparison: Right foot exam from 04/30/2009.

CLINICAL DATA: Great toe injury with pain.

RIGHT TOE - 2+ VIEW

[t toes ap right]
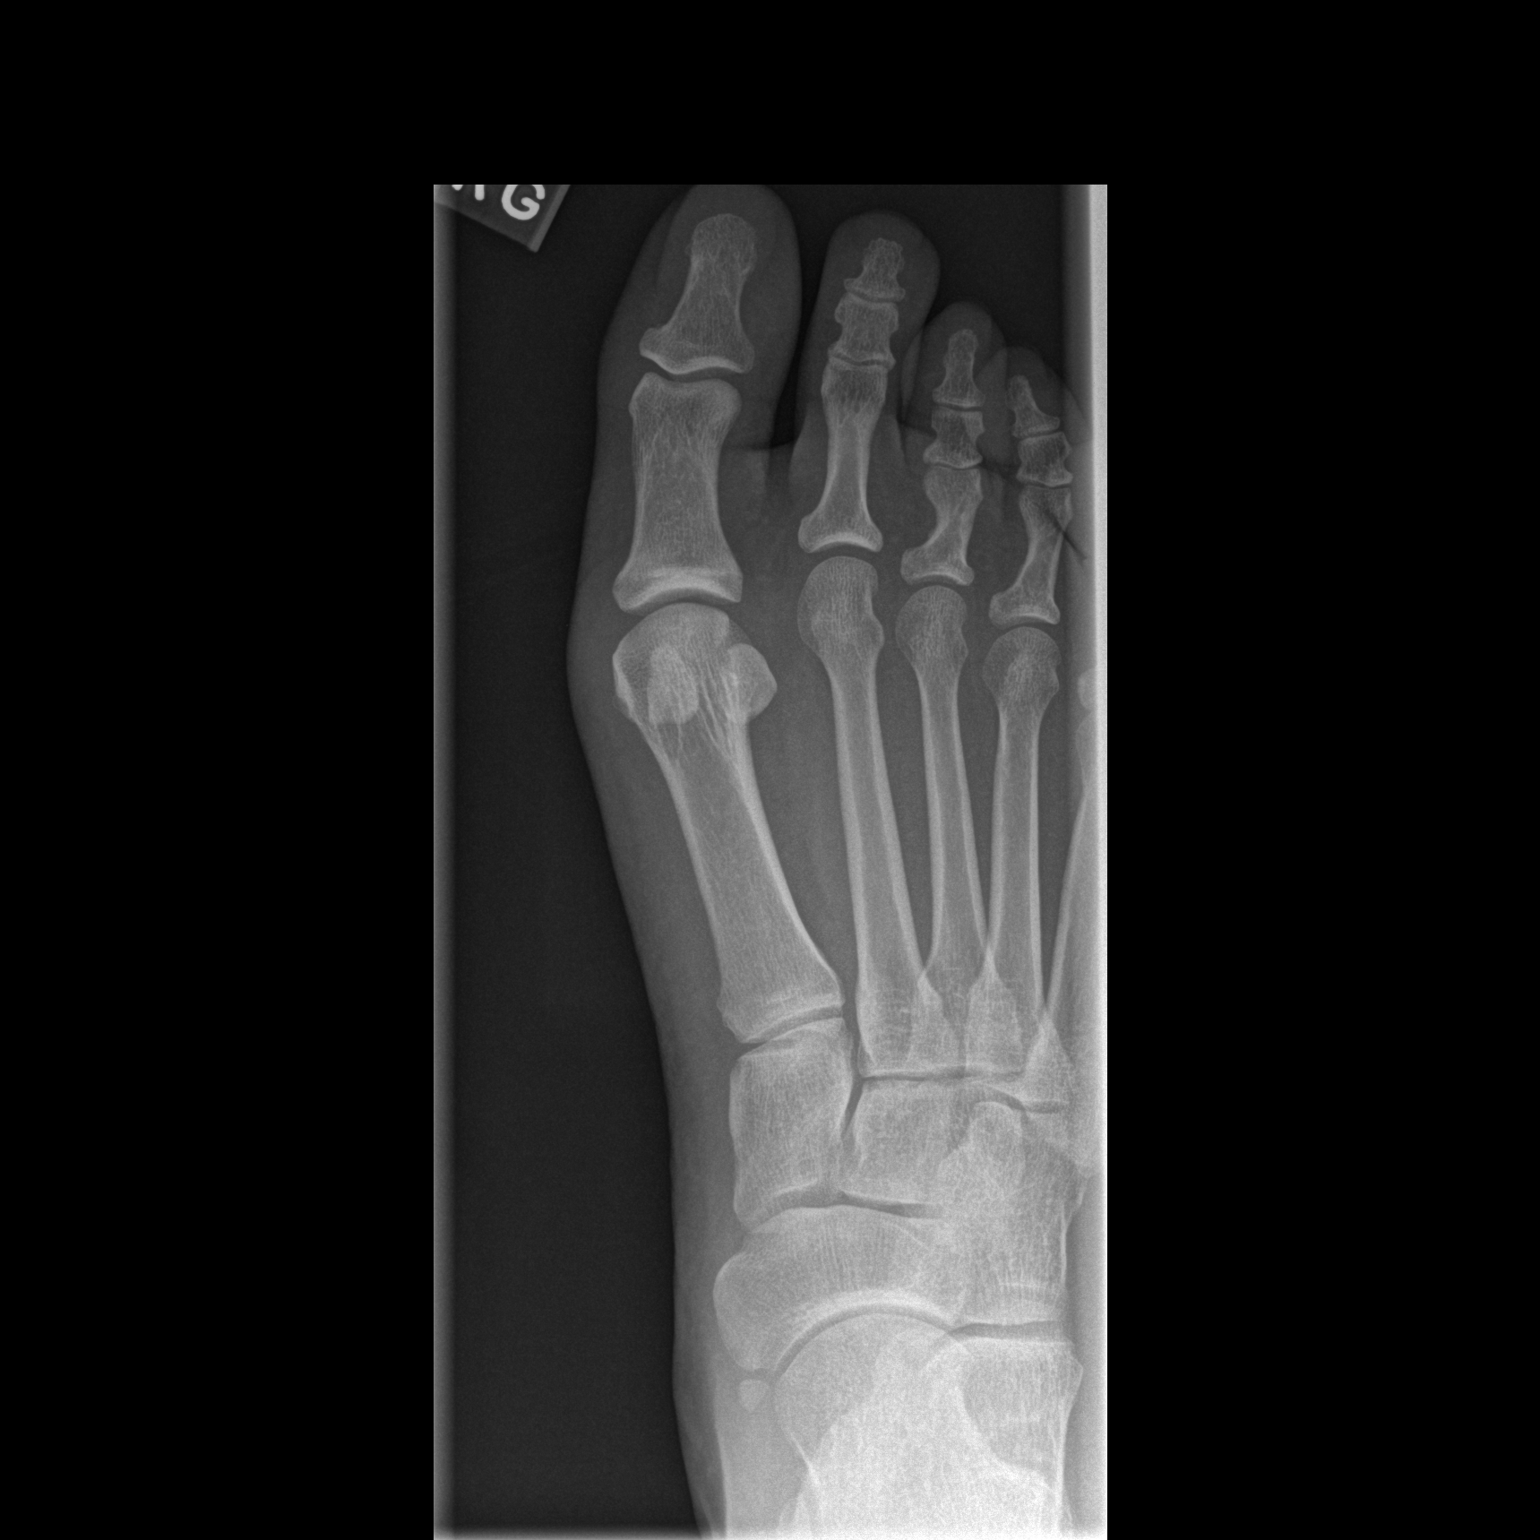

[t toes oblique right]
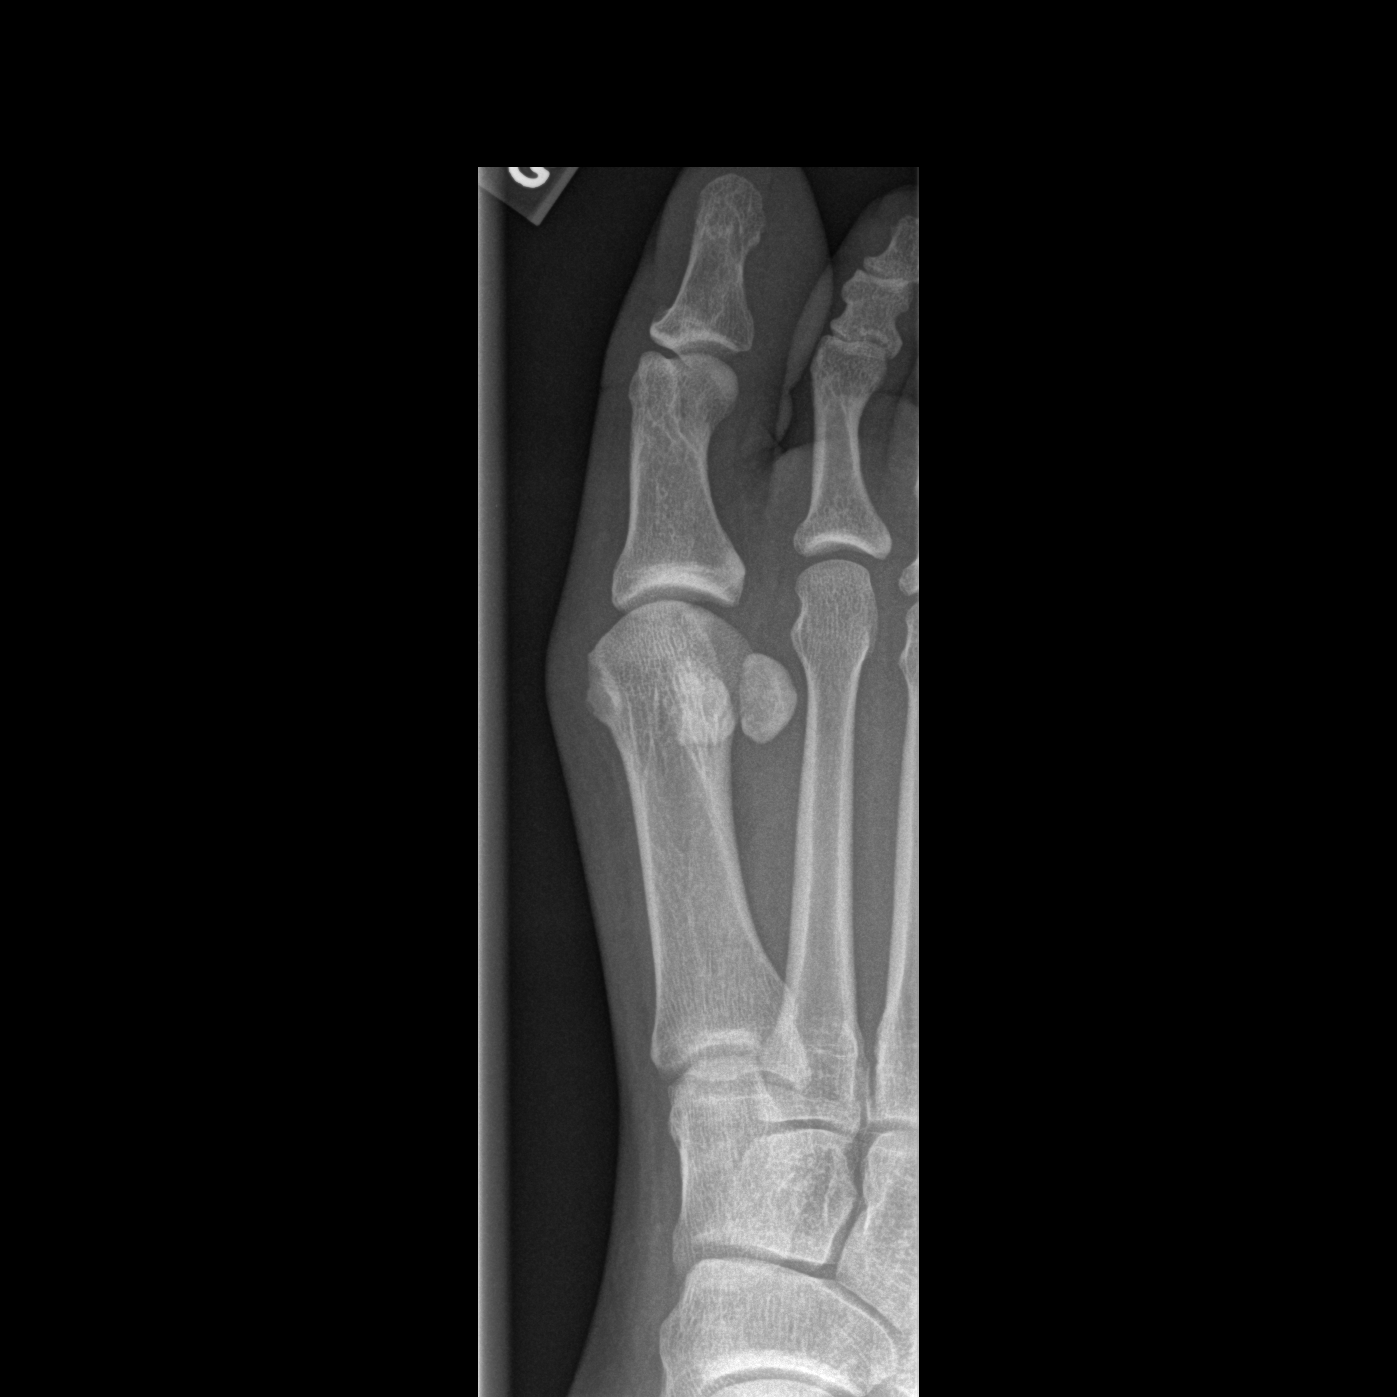

[t toes lateral right]
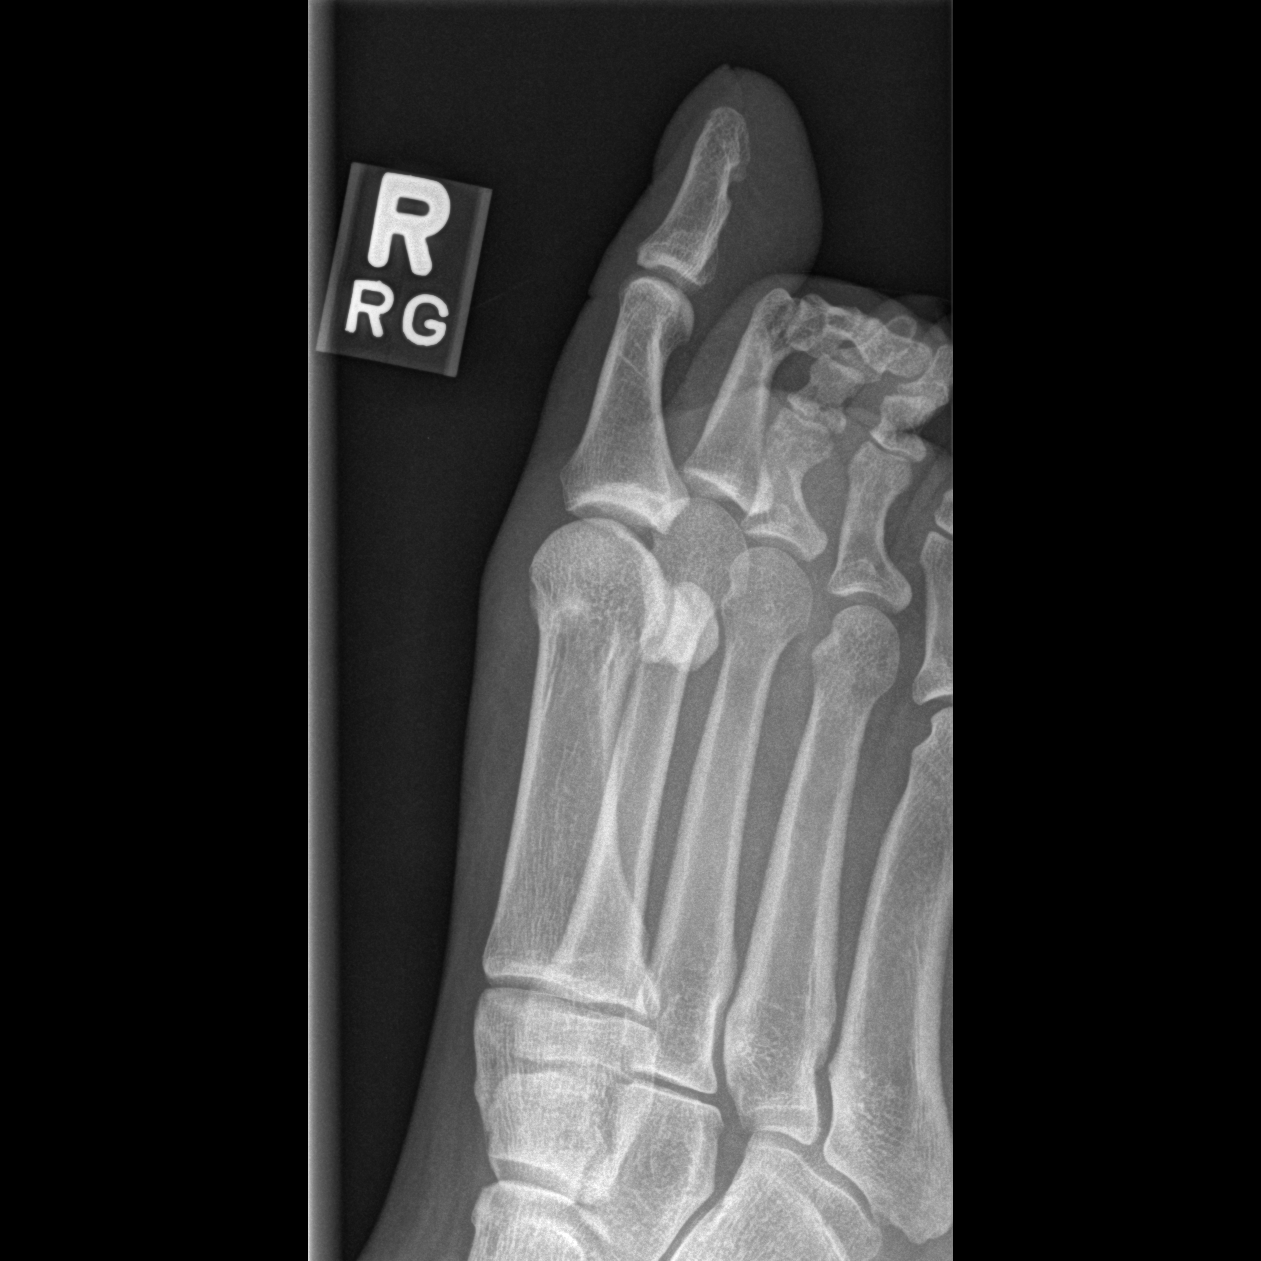

[3 of 3 positions shown; findings below may reference images not displayed]

FINDINGS: There is no acute fracture.  No subluxation or
dislocation.  Overlying soft tissues are unremarkable.
IMPRESSION: No acute bony abnormality.

## 2009-07-22 ENCOUNTER — Encounter: Admission: RE | Admit: 2009-07-22 | Discharge: 2009-09-21 | Payer: Self-pay | Admitting: Orthopedic Surgery

## 2009-12-02 ENCOUNTER — Ambulatory Visit: Payer: Self-pay | Admitting: Family Medicine

## 2009-12-02 LAB — CONVERTED CEMR LAB
Bilirubin Urine: NEGATIVE
Glucose, Urine, Semiquant: NEGATIVE
Protein, U semiquant: 100
Urobilinogen, UA: 1
pH: 6.5

## 2009-12-03 ENCOUNTER — Encounter (INDEPENDENT_AMBULATORY_CARE_PROVIDER_SITE_OTHER): Payer: Self-pay | Admitting: Family Medicine

## 2009-12-15 ENCOUNTER — Telehealth: Payer: Self-pay | Admitting: Family Medicine

## 2009-12-16 ENCOUNTER — Ambulatory Visit: Payer: Self-pay | Admitting: Family Medicine

## 2010-01-31 ENCOUNTER — Other Ambulatory Visit: Payer: Self-pay | Admitting: Emergency Medicine

## 2010-01-31 ENCOUNTER — Inpatient Hospital Stay (HOSPITAL_COMMUNITY): Admission: AD | Admit: 2010-01-31 | Discharge: 2010-02-04 | Payer: Self-pay | Admitting: Psychiatry

## 2010-01-31 ENCOUNTER — Ambulatory Visit: Payer: Self-pay | Admitting: Psychiatry

## 2010-02-04 ENCOUNTER — Encounter: Payer: Self-pay | Admitting: Family Medicine

## 2010-05-23 ENCOUNTER — Ambulatory Visit: Payer: Self-pay | Admitting: Family Medicine

## 2010-05-23 ENCOUNTER — Encounter: Payer: Self-pay | Admitting: Family Medicine

## 2010-05-23 DIAGNOSIS — F319 Bipolar disorder, unspecified: Secondary | ICD-10-CM

## 2010-06-24 ENCOUNTER — Encounter: Payer: Self-pay | Admitting: Family Medicine

## 2010-06-24 ENCOUNTER — Other Ambulatory Visit: Admission: RE | Admit: 2010-06-24 | Discharge: 2010-06-24 | Payer: Self-pay | Admitting: Family Medicine

## 2010-06-24 ENCOUNTER — Ambulatory Visit: Payer: Self-pay | Admitting: Family Medicine

## 2010-06-24 LAB — CONVERTED CEMR LAB
Alkaline Phosphatase: 104 units/L (ref 39–117)
BUN: 9 mg/dL (ref 6–23)
Basophils Relative: 0 % (ref 0–1)
Chlamydia, DNA Probe: NEGATIVE
Creatinine, Ser: 0.73 mg/dL (ref 0.40–1.20)
Eosinophils Absolute: 0.2 10*3/uL (ref 0.0–0.7)
Eosinophils Relative: 1 % (ref 0–5)
Glucose, Bld: 96 mg/dL (ref 70–99)
HCT: 40.9 % (ref 36.0–46.0)
HDL: 41 mg/dL (ref >39)
LDL Cholesterol: 186 mg/dL — ABNORMAL HIGH (ref 0–99)
Lymphs Abs: 3.1 10*3/uL (ref 0.7–4.0)
MCHC: 32.8 g/dL (ref 30.0–36.0)
MCV: 83.8 fL (ref 78.0–100.0)
Monocytes Relative: 4 % (ref 3–12)
RBC: 4.88 M/uL (ref 3.87–5.11)
Total Bilirubin: 0.2 mg/dL — ABNORMAL LOW (ref 0.3–1.2)
Total CHOL/HDL Ratio: 6
Triglycerides: 90 mg/dL (ref <150)
VLDL: 18 mg/dL (ref 0–40)
WBC: 15 10*3/uL — ABNORMAL HIGH (ref 4.0–10.5)

## 2010-06-29 ENCOUNTER — Encounter: Payer: Self-pay | Admitting: Family Medicine

## 2010-10-04 ENCOUNTER — Encounter: Payer: Self-pay | Admitting: Family Medicine

## 2010-10-13 ENCOUNTER — Encounter: Payer: Self-pay | Admitting: Family Medicine

## 2010-12-24 ENCOUNTER — Encounter: Payer: Self-pay | Admitting: Internal Medicine

## 2010-12-25 ENCOUNTER — Encounter: Payer: Self-pay | Admitting: Orthopedic Surgery

## 2011-01-05 NOTE — Assessment & Plan Note (Signed)
Summary: cpp/fasting labs/eo   Vital Signs:  Patient profile:   45 year old female Height:      63.75 inches Weight:      117.8 pounds BMI:     20.45 Pulse rate:   92 / minute BP sitting:   109 / 73  (left arm) Cuff size:   regular  Vitals Entered By: Arlyss Repress CMA, (June 24, 2010 8:52 AM) CC: physical with pap. STD check. Is Patient Diabetic? No Pain Assessment Patient in pain? no        Primary Care Provider:  Asher Muir MD  CC:  physical with pap. STD check..  History of Present Illness: Here for CPE and labs per mental health.  Has been followed by mental health for years for schizoaffective disorder/depressio/ bipolar illness.  Last behavioral health admission was 02/2010.  Reports improvement in mood with increase in Seroquel. 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  She smokes and is not interested in quiting at this time.  Constipation a problem often goes 1 time per week, tried OTC laxatives that caused pain and cramping.  Habits & Providers  Alcohol-Tobacco-Diet     Tobacco Status: current     Tobacco Counseling: to quit use of tobacco products     Cigarette Packs/Day: 1.0  Current Medications (verified): 1)  Seroquel 300 Mg Tabs (Quetiapine Fumarate) .... Tid 2)  Lithium Carbonate 300 Mg Caps (Lithium Carbonate) .... One Two Times A Day 3)  Mirtazapine 30 Mg Tabs (Mirtazapine) .... One Qhs 4)  Polyethylene Glycol 3350  Powd (Polyethylene Glycol 3350) .Marland KitchenMarland KitchenMarland Kitchen 17 Gm in Water Daily, Qs 5)  Ventolin Hfa 108 (90 Base) Mcg/act Aers (Albuterol Sulfate) .... 2 Puffs Qid Prn  Allergies (verified): No Known Drug Allergies  Past History:  Past Medical History: Last updated: 05/23/2010 G2P1011 (NSVD, missed SAB > 20 wks) 10/12/07 - work related neck injury - being followed by Dr. Lanny Hurst (s/p Surgery by Dr. Jeral Fruit 11/16/07) Uterine fibroids - stable on 12/08 [place] as compared to 6/08 [place]. 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 Jeral Fruit)  Family History: Last updated: 05/23/2010 No family history of breast cancer in 1st degree relatives.  Maternal Aunt - breast cancer Father with Multiple CVA's Mother-mental illness  Social History: Last updated: 05/23/2010 89 year old daughter lives with her part time History of work-related injury in 11/08 causing decreased time at school/work)   + smoker < 1 PPD.    Family History: Reviewed history from 05/23/2010 and no changes required. No family history of breast cancer in 1st degree relatives.  Maternal Aunt - breast cancer Father with Multiple CVA's Mother-mental illness  Social History: Reviewed history from 05/23/2010 and no changes required. 36 year old daughter lives with her part time History of work-related injury in 11/08 causing decreased time at school/work)   + smoker < 1 PPD.    Review of Systems      See HPI General:  Denies fatigue and weight loss. Resp:  Complains of cough; denies sputum productive and wheezing. GI:  Complains of constipation; denies abdominal  pain. GU:  Complains of discharge; denies dysuria. MS:  neck pain. Neuro:  Complains of visual disturbances; denies disturbances in coordination, inability to speak, poor balance, and tremors; loss of vision secondary to optic neuritis.  Physical Exam  General:  Alert, thin AA female Ears:  External ear exam shows no significant lesions or deformities.  Otoscopic examination reveals clear canals, tympanic membranes are intact bilaterally without bulging,  retraction, inflammation or discharge. Mouth:  pharynx pink and moist and fair dentition.   Neck:  limited ROM in extension, good flexion, no masses, no thyromegaly Breasts:  No mass, nodules, thickening, tenderness, bulging, retraction, inflamation, nipple discharge or skin changes noted.   Lungs:  normal respiratory effort and normal breath sounds.   Heart:  normal rate and regular rhythm.   Abdomen:  soft, non-tender, and normal bowel sounds.   Genitalia:  White thick vaginal dischargenormal introitus, no external lesions, mucosa pink and moist, cervix bled with brushing, normal uterus size and position, and no adnexal masses or tenderness.     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 Tid 2)  Lithium Carbonate 300 Mg Caps (Lithium carbonate) .... One two times a day 3)  Mirtazapine 30 Mg Tabs (Mirtazapine) .... One qhs 4)  Polyethylene Glycol 3350 Powd (Polyethylene glycol 3350) .Marland KitchenMarland Kitchen. 17 gm in water daily, qs 5)  Ventolin Hfa 108 (90 Base) Mcg/act Aers (Albuterol sulfate) .... 2 puffs  qid prn  Other Orders: Comp Met-[facility] [id number]) TSH-[facility] ([id number]) CBC w/Diff-[facility] (32951) Miscellaneous Lab Charge-[facility] [phone]) Mammogram (Screening) (Mammo)  Patient Instructions: 1)  Return in the fall for your flu shot and follow up apt (October.Nov) 2)  Please have your mammogram 3)  Use the PEG daily to get stool soft then use as needed, you may use two times a day if needed as well. Prescriptions: VENTOLIN HFA 108 (90 BASE) MCG/ACT AERS (ALBUTEROL SULFATE) 2 puffs qid prn  #1 x 6   Entered and Authorized by:   Luretha Murphy NP   Signed by:   Luretha Murphy NP on 06/24/2010   Method used:   Electronically to        [facility]       [address]       [place], [place]  [address]       Ph: [phone]       Fax: [phone]   RxID:   [id number] POLYETHYLENE GLYCOL 3350  POWD (POLYETHYLENE GLYCOL 3350) 17 GM in water daily, qs  #1 x 3   Entered and Authorized by:   Luretha Murphy NP   Signed by:   Luretha Murphy NP  on 06/24/2010   Method used:   Electronically to        [facility]       [address]       [place], [place]  [address]       Ph: [phone]       Fax: [phone]   RxID:   [id number]    Prevention & Chronic Care Immunizations   Influenza vaccine: Not documented    Tetanus booster: Not documented    Pneumococcal vaccine: Not documented  Other Screening   Pap smear: normal  (12/11/2007)   Pap smear due: 12/2009    Mammogram: Done.  (11/03/2006)   Mammogram action/deferral: Ordered  (06/24/2010)   Mammogram due: 11/04/2007   Smoking status: current  (06/24/2010)   Smoking cessation counseling: yes  (01/25/2009)  Lipids   Total Cholesterol: Not documented   LDL: Not documented   LDL Direct: 213  (03/11/2009)   HDL: Not documented   Triglycerides: Not documented    SGOT (AST): 20  (03/11/2009)   SGPT (ALT): 17  (03/11/2009) CMP ordered     Alkaline phosphatase: 135  (03/11/2009)   Total bilirubin: 0.3  (03/11/2009)    Lipid flowsheet reviewed?: Yes   Progress toward LDL goal: Deteriorated  Self-Management Support :   Personal Goals (by the next clinic visit) :      Personal LDL goal: 130  (06/24/2010)    Lipid self-management support: Not documented    Nursing Instructions: Schedule screening mammogram (see order)     Laboratory Results  Date/Time Received: June 24, 2010 9:37 AM  Date/Time Reported: June 24, 2010 9:54 AM   Wet [place] Source: vag WBC/hpf: 1-5 Bacteria/hpf: 3+  Cocci Clue cells/hpf: many  Positive whiff Yeast/hpf: none Trichomonas/hpf: none Comments: ...............test performed by......Marland KitchenBonnie A. [place], MLS (ASCP)cm

## 2011-01-05 NOTE — Assessment & Plan Note (Signed)
Summary: ASTHMA/[place]/[address]   Vital Signs:  Patient profile:   45 year old female Height:      63.75 inches Weight:      137 pounds BMI:     23.79 O2 Sat:      100 % on Room air Temp:     98.2 degrees F oral Pulse rate:   116 / minute BP sitting:   95 / 65  Vitals Entered By: Tessie Fass CMA (December 16, 2009 8:35 AM)  O2 Flow:  Room air CC: asthma. weezing and coughing x 2 weeks. Is Patient Diabetic? No Pain Assessment Patient in pain? no        Primary Care Provider:  Asher Muir MD  CC:  asthma. weezing and coughing x 2 weeks.[name]  History of Present Illness: [name] comes in today for coughing and wheezing for 2 weeks.  2 weeks ago she was seen and diagnosed with the flu.  She has recovered relatively well from that except she is still having coughing and wheezing.  She otherwise feels okay and has had no more fevers.  She is using robitussin and mucinex D for the symptoms with some relief.  Has had occassional issues with wheezing/"asthma" in the past.  She states usually after going to the beach.  She states she has had to have occassional breathing treatments here and that she used to be on advair.  I don't see Advair on her med list here now or in the past.  She's not sure if she ever really had an albuterol inhaler.   Habits & Providers  Alcohol-Tobacco-Diet     Tobacco Status: current     Cigarette Packs/Day: 1.0  Allergies: No Known Drug Allergies  Physical Exam  General:  alert, well-developed, well-nourished, and well-hydrated.  NAD. vitals reviewed Eyes:  conjunctiva clear, moist, no injection Mouth:  oropharynx pink and moist Lungs:  normal work of breathing.  occassional faint end expiratory wheezing.  no crackles.  good breath sounds. Heart:  slightly tachy, sinus, no murmurs   Impression & Recommendations:  Problem # 1:  COUGH (ICD-786.2) Assessment New  Discussed that advair not really the best short term medication for her.  Will try  albuterol for this wheezing with viral illness.  She is a pack a day smoker.  She could have some component of COPD and when better her pcp could consider PFTs.  Advised smoking cessation.  Given psych history and meds, pharmacologic assistance with quitting is not a good option.  SHe would need nicotine replacement.  ADvised if she worsens or develops fever she should return as could develop secondary pneumonia and CXR may be warranted.   Orders: [facility]- Est Level  3 (11914)  Complete Medication List: 1)  Risperdal 1 Mg Tabs (Risperidone) .... At bedtime - dose unkown 2)  Sertraline Hcl 100 Mg Tabs (Sertraline hcl) .[name].. 1 tablet by mouth qam 3)  Lamotrigine 25 Mg Tabs (Lamotrigine) .[name].. 1 tablet by mouth qam x 2 weeks, then 2 tablets by mouth qam 4)  Seroquel 50 Mg Tabs (Quetiapine fumarate) .[name].. 1 tablet po at bedtime 5)  Ranitidine Hcl 150 Mg Caps (Ranitidine hcl) .[name].. 1 tablet by mouth two times a day for acid reflux - do not drink alcohol with this medicine 6)  Promethazine Hcl 25 Mg Tabs (Promethazine hcl) .[name].. 1 tablet by mouth q6h as needed for nausea 7)  Hydroxyzine Hcl 25 Mg Tabs (Hydroxyzine hcl) .[name].. 1 tablet by mouth at bedtime as needed  for itching - do not drive while taking 8)  Loratadine 10 Mg Tabs (Loratadine) .[name].. 1 tablet by mouth every morning as needed for itching 9)  Ventolin Hfa 108 (90 Base) Mcg/act Aers (Albuterol sulfate) .... 2 puffs inhaled using spacer q 3-4 hours as needed cough, wheezing, or shortness of breath 10)  Aerochamber Plus Misc (Spacer/aero-holding chambers) .... Use with ventolin mdi as directed  Patient Instructions: 1)  You can use the inhaler as you need it for cough, wheezing, or shortness of breath.  Try 2 puffs inhaled every 3-4 hours. 2)  Stay hydrated as this will help thin the mucus so it is easier to clear. 3)  Get plenty of rest. 4)  If you worsen or develop a fever again, please come back. 5)  Quitting smoking would benefit you greatly!  You  would be much less susceptible to these infections and would not get as sick from them.   Please follow-up with your new doctor, Dr. Asher Muir, about options to help you quit.   Prescriptions: AEROCHAMBER PLUS  MISC (SPACER/AERO-HOLDING CHAMBERS) use with ventolin MDI as directed  #1 x 0   Entered and Authorized by:   Ardeen Garland  MD   Signed by:   Ardeen Garland  MD on 12/16/2009   Method used:   Print then Give to Patient   RxID:   [id number] VENTOLIN HFA 108 (90 BASE) MCG/ACT AERS (ALBUTEROL SULFATE) 2 puffs inhaled using spacer q 3-4 hours as needed cough, wheezing, or shortness of breath  #1 x 3   Entered and Authorized by:   Ardeen Garland  MD   Signed by:   Ardeen Garland  MD on 12/16/2009   Method used:   Print then Give to Patient   RxID:   [id number]

## 2011-01-05 NOTE — Miscellaneous (Signed)
  Medications Added SIMVASTATIN 40 MG TABS (SIMVASTATIN) one at bedtime       Clinical Lists Changes  Medications: Added new medication of SIMVASTATIN 40 MG TABS (SIMVASTATIN) one at bedtime - Signed Rx of SIMVASTATIN 40 MG TABS (SIMVASTATIN) one at bedtime;  #30 x 3;  Signed;  Entered by: Luretha Murphy NP;  Authorized by: Luretha Murphy NP;  Method used: Electronically to [facility] [address] [place], [place], [place]  [address], Ph: [phone], Fax: [phone]    Prescriptions: SIMVASTATIN 40 MG TABS (SIMVASTATIN) one at bedtime  #30 x 3   Entered and Authorized by:   Luretha Murphy NP   Signed by:   Luretha Murphy NP on 06/29/2010   Method used:   Electronically to        [facility]       [address]       [place], [place]  [address]       Ph: [phone]       Fax: [phone]   RxID:   [id number]

## 2011-01-05 NOTE — Miscellaneous (Signed)
   Clinical Lists Changes  Problems: Removed problem of SCHIZOAFFECTIVE DISORDER, DEPRESSED TYPE (ICD-295.70) Removed problem of FATIGUE (ICD-780.79)

## 2011-01-05 NOTE — Letter (Signed)
Summary: Generic Letter/lipid letter  [facility]  [address]   [place], [place] [address]   Phone: [phone]  Fax: [phone]    06/29/2010  [name] [address] [facility] [address] [place], [place]  [address]  Dear Ms. Stacks,  All of your tests are normal except you cholesterol.  It is very high.  We probably should start a cholesterol lowering medication to reduce your long term risk of having a stroke or heart attack.  I would like to begin simvastatin 40 mg at bedtime.  I will send it into you pharmacy.  I would like you to make a appointment with me 6 weeks after starting the simvasatin to check your labs and see how you are doing.  If you would like to talk to me first please make an appointment sooner.  This medication would be considered preventive for problems later in life, and you would likely need to stay on it for life.      Sincerely,   Luretha Murphy NP  Appended Document: Generic Letter/lipid letter mailed

## 2011-01-05 NOTE — Miscellaneous (Signed)
   Clinical Lists Changes  Problems: Removed problem of CONTACT OR EXPOSURE TO OTHER VIRAL DISEASES (ICD-V01.79) Removed problem of History of  HERNIATED CERVICAL DISC (ICD-722.0) Removed problem of SCREENING FOR MALIGNANT NEOPLASM OF THE CERVIX (ICD-V76.2) Removed problem of WELL ADULT EXAM (ICD-V70.0) Removed problem of HEMORRHOIDS, NOS (ICD-455.6) Removed problem of FIBROIDS, UTERUS (ICD-218.9) Removed problem of INSOMNIA (ICD-780.52)

## 2011-01-05 NOTE — Assessment & Plan Note (Signed)
Summary: out of meds/crying/[place]/mayans   Vital Signs:  Patient profile:   45 year old female Height:      63.75 inches Weight:      116 pounds BMI:     20.14 Temp:     99.0 degrees F oral Pulse rate:   102 / minute BP sitting:   97 / 67  (left arm) Cuff size:   regular  Vitals Entered By: Tessie Fass cma (May 23, 2010 3:02 PM) CC: med refill Is Patient Diabetic? No Pain Assessment Patient in pain? no        Primary Care Provider:  Asher Muir MD  CC:  med refill.  History of Present Illness: Out of psych meds for 2 days, crying non stop.  67 year old daughter accompanied her today.  Last psych meds prescribed by Dr. Dub Mikes during inpatient stay in April 2011.  Mixed mood disorder, has been on Seroquel for years through the oupatient program, was started on Litihium and mirtazapine during hospitalization.  She has an appointment with mental health at the [facility] July 23.  She has been attending [employer].  Reports feeling very sad.  Denies hallucinations, voices or suicidal ideation.  Habits & Providers  Alcohol-Tobacco-Diet     Tobacco Status: current     Tobacco Counseling: to quit use of tobacco products     Cigarette Packs/Day: 1.0  Current Medications (verified): 1)  Seroquel 100 Mg Tabs (Quetiapine Fumarate) .... Three Times A Day 2)  Seroquel 300 Mg Tabs (Quetiapine Fumarate) .... One Qhs 3)  Lithium Carbonate 300 Mg Caps (Lithium Carbonate) .... One Two Times A Day 4)  Mirtazapine 30 Mg Tabs (Mirtazapine) .... One Qhs  Allergies (verified): No Known Drug Allergies  Past History:  Past Medical History: G2P1011 (NSVD, missed SAB > 20 wks) 10/12/07 - work related neck injury - being followed by Dr. Lanny Hurst (s/p Surgery by Dr. Jeral Fruit 11/16/07) Uterine fibroids - stable on 12/08 [place] as compared to 6/08 [place]. 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  Maternal Aunt - breast cancer Father with Multiple CVA's Mother-mental illness  Social History: 47 year old daughter lives with her part time History of work-related injury in 11/08 causing decreased time at school/work)   + smoker < 1 PPD.    Review of Systems General:  Complains of loss of appetite, malaise, and sleep disorder. MS:  neck pain. Psych:  Complains of anxiety, depression, easily tearful, mental problems, and panic attacks; denies alternate hallucination ( auditory/visual), suicidal thoughts/plans, thoughts of violence, and unusual visions or sounds.  Physical Exam  General:  Alert, thin AA female, crying most of the encounter Psych:  flat affect, tearful, and slightly anxious.     Impression & Recommendations:  Problem # 1:  SCHIZOAFFECTIVE DISORDER, DEPRESSED TYPE (ICD-295.70) Refilled meds per the bottle that she brough in, these were started at psych admission in Feb/March this year.  WIll scan discharge summary into EMR for reference.  She currently does not have a primary provider assigned at [facility] because of limited asscess here. 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 Three times a day 2)  Seroquel 300 Mg Tabs (Quetiapine fumarate) .... One qhs 3)  Lithium Carbonate 300 Mg Caps (Lithium carbonate) .... One two times a day 4)  Mirtazapine 30 Mg Tabs (Mirtazapine) .... One qhs  Patient Instructions: 1)  Assign to [place], follow up in one month for PAP, and fasting labs, so try to make a morning  apt. 2)  Do not take Lithium the day of apt. Prescriptions: MIRTAZAPINE 30 MG TABS (MIRTAZAPINE) one qhs  #30 x 3   Entered and Authorized by:   Luretha Murphy NP   Signed by:   Luretha Murphy NP on 05/23/2010   Method used:   Electronically to        [facility] (retail)       [address]       [place], [place]  [address]       Ph: [phone]       Fax: [phone]   RxID:   [id number] LITHIUM CARBONATE 300 MG CAPS (LITHIUM CARBONATE) one two times a day  #60 x 3   Entered and Authorized by:   Luretha Murphy NP   Signed by:   Luretha Murphy NP on 05/23/2010   Method used:   Electronically to        [facility] ([facility])       [address]       [place], [place]  [address]       Ph: [phone]       Fax: [phone]   RxID:   [id number] SEROQUEL 300 MG TABS (QUETIAPINE FUMARATE) one qhs  #30 x 3   Entered and Authorized by:   Luretha Murphy NP   Signed by:   Luretha Murphy NP on 05/23/2010   Method used:   Electronically to        [facility] ([facility])       [address]       [place], [place]  [address]       Ph: [phone]       Fax: [phone]   RxID:   [id number] SEROQUEL 100 MG TABS (QUETIAPINE FUMARATE) three times a day  #90 x 3   Entered and Authorized by:   Luretha Murphy NP   Signed by:   Luretha Murphy NP on 05/23/2010   Method used:   Electronically to        [facility] ([facility])       [address]       [place], [place]  [address]       Ph: [phone]       Fax: [phone]   RxID:   [id number]    Prevention & Chronic Care Immunizations   Influenza vaccine: Not documented    Tetanus booster: Not documented    Pneumococcal vaccine: Not documented  Other Screening   Pap smear: normal  (12/11/2007)   Pap smear due: 12/2009    Mammogram: Done.  (11/03/2006)   Mammogram due:  11/04/2007   Smoking status: current  (05/23/2010)   Smoking cessation counseling: yes  (01/25/2009)  Lipids   Total Cholesterol: Not documented   LDL: Not documented  LDL Direct: 213  (03/11/2009)   HDL: Not documented   Triglycerides: Not documented    SGOT (AST): 20  (03/11/2009)   SGPT (ALT): 17  (03/11/2009)   Alkaline phosphatase: 135  (03/11/2009)   Total bilirubin: 0.3  (03/11/2009)  Self-Management Support :    Lipid self-management support: Not documented

## 2011-01-05 NOTE — Miscellaneous (Signed)
Summary: off meds   Clinical Lists Changes she is out of her meds. next appt is 06/13/10. crying at the thought she will have to be out of meds that long. has been without 3 days. asked her to come now & we will fit her into schedule. told her there may be a wait. she is ok with this.Golden Circle RN  May 23, 2010 2:31 PM   Patient seen, will accept her as primary patient, she has not been assigned Luretha Murphy NP  May 24, 2010 9:02 AM

## 2011-01-05 NOTE — Progress Notes (Signed)
Summary: triage   Phone Note Call from Patient Call back at Home Phone [phone]   Caller: Patient Summary of Call: has asthma and is wheezing - just over the flu and would like to talk to nurse needs a refill Advair - [facility] Initial call taken by: De Nurse,  December 15, 2009 3:34 PM  Follow-up for Phone Call        advair not on her list. states it has been a long time since she used it. has just gotten over the flu. appt made for 8:30am tomorrow iin workin. told her if asthma got worse overnight go to ED. she agreed Follow-up by: Golden Circle RN,  December 15, 2009 3:42 PM

## 2011-01-05 NOTE — Letter (Signed)
Summary: Behavioral Health  Behavioral Health   Imported By: Clydell Hakim 06/01/2010 15:28:10  _____________________________________________________________________  External Attachment:    Type:   Image     Comment:   External Document

## 2011-02-13 ENCOUNTER — Ambulatory Visit: Payer: Self-pay | Admitting: Family Medicine

## 2011-02-24 LAB — VALPROIC ACID LEVEL: Valproic Acid Lvl: <10 ug/mL — ABNORMAL LOW (ref 50.0–100.0)

## 2011-02-24 LAB — BASIC METABOLIC PANEL
Calcium: 9.4 mg/dL (ref 8.4–10.5)
GFR calc non Af Amer: >60 mL/min (ref >60)
Glucose, Bld: 108 mg/dL — ABNORMAL HIGH (ref 70–99)
Sodium: 136 mEq/L (ref 135–145)

## 2011-02-24 LAB — DIFFERENTIAL
Basophils Absolute: 0.1 10*3/uL (ref 0.0–0.1)
Lymphocytes Relative: 21 % (ref 12–46)
Monocytes Absolute: 1.2 10*3/uL — ABNORMAL HIGH (ref 0.1–1.0)
Neutro Abs: 10.2 10*3/uL — ABNORMAL HIGH (ref 1.7–7.7)

## 2011-02-24 LAB — RAPID URINE DRUG SCREEN, HOSP PERFORMED: Benzodiazepines: POSITIVE — AB

## 2011-02-24 LAB — CBC
Hemoglobin: 13.1 g/dL (ref 12.0–15.0)
Platelets: 321 10*3/uL (ref 150–400)
RDW: 16.5 % — ABNORMAL HIGH (ref 11.5–15.5)
WBC: 14.6 10*3/uL — ABNORMAL HIGH (ref 4.0–10.5)

## 2011-02-24 LAB — ETHANOL: Alcohol, Ethyl (B): <5 mg/dL (ref 0–10)

## 2011-02-24 LAB — ACETAMINOPHEN LEVEL: Acetaminophen (Tylenol), Serum: <10 ug/mL — ABNORMAL LOW (ref 10–30)

## 2011-02-27 LAB — URINE MICROSCOPIC-ADD ON

## 2011-02-27 LAB — HEPATIC FUNCTION PANEL
Bilirubin, Direct: 0.2 mg/dL (ref 0.0–0.3)
Indirect Bilirubin: 0.5 mg/dL (ref 0.3–0.9)
Total Protein: 7.2 g/dL (ref 6.0–8.3)

## 2011-02-27 LAB — URINALYSIS, ROUTINE W REFLEX MICROSCOPIC
Glucose, UA: NEGATIVE mg/dL
Protein, ur: NEGATIVE mg/dL
Specific Gravity, Urine: 1.009 (ref 1.005–1.030)
Urobilinogen, UA: 0.2 mg/dL (ref 0.0–1.0)

## 2011-02-27 LAB — TSH: TSH: 3.389 u[IU]/mL (ref 0.350–4.500)

## 2011-03-14 ENCOUNTER — Ambulatory Visit: Payer: Self-pay | Admitting: Family Medicine

## 2011-03-14 LAB — CBC
HCT: 39.8 % (ref 36.0–46.0)
MCHC: 32.8 g/dL (ref 30.0–36.0)
MCV: 74.2 fL — ABNORMAL LOW (ref 78.0–100.0)
MCV: 75.5 fL — ABNORMAL LOW (ref 78.0–100.0)
Platelets: 359 10*3/uL (ref 150–400)
Platelets: 390 10*3/uL (ref 150–400)
RBC: 5.27 MIL/uL — ABNORMAL HIGH (ref 3.87–5.11)
RDW: 18.4 % — ABNORMAL HIGH (ref 11.5–15.5)
WBC: 15.4 10*3/uL — ABNORMAL HIGH (ref 4.0–10.5)

## 2011-03-14 LAB — DIFFERENTIAL
Basophils Relative: 0 % (ref 0–1)
Eosinophils Absolute: 0.1 10*3/uL (ref 0.0–0.7)
Eosinophils Absolute: 0.1 10*3/uL (ref 0.0–0.7)
Eosinophils Relative: 1 % (ref 0–5)
Lymphs Abs: 3 10*3/uL (ref 0.7–4.0)
Monocytes Relative: 6 % (ref 3–12)
Neutrophils Relative %: 75 % (ref 43–77)

## 2011-03-14 LAB — URINALYSIS, ROUTINE W REFLEX MICROSCOPIC
Bilirubin Urine: NEGATIVE
Glucose, UA: NEGATIVE mg/dL
Ketones, ur: NEGATIVE mg/dL
Leukocytes, UA: NEGATIVE
pH: 7 (ref 5.0–8.0)

## 2011-03-14 LAB — BASIC METABOLIC PANEL
BUN: 5 mg/dL — ABNORMAL LOW (ref 6–23)
Chloride: 106 mEq/L (ref 96–112)
Potassium: 3.9 mEq/L (ref 3.5–5.1)

## 2011-03-14 LAB — ETHANOL: Alcohol, Ethyl (B): <5 mg/dL (ref 0–10)

## 2011-03-14 LAB — RAPID URINE DRUG SCREEN, HOSP PERFORMED
Barbiturates: NOT DETECTED
Cocaine: NOT DETECTED
Opiates: NOT DETECTED

## 2011-03-14 LAB — URINE MICROSCOPIC-ADD ON

## 2011-03-14 LAB — PREGNANCY, URINE: Preg Test, Ur: NEGATIVE

## 2011-03-15 LAB — URINALYSIS, ROUTINE W REFLEX MICROSCOPIC
Bilirubin Urine: NEGATIVE
Bilirubin Urine: NEGATIVE
Glucose, UA: NEGATIVE mg/dL
Ketones, ur: NEGATIVE mg/dL
Nitrite: NEGATIVE
Protein, ur: NEGATIVE mg/dL
Specific Gravity, Urine: 1.014 (ref 1.005–1.030)
Specific Gravity, Urine: 1.005 (ref 1.005–1.030)
Urobilinogen, UA: 0.2 mg/dL (ref 0.0–1.0)
pH: 7 (ref 5.0–8.0)

## 2011-03-15 LAB — COMPREHENSIVE METABOLIC PANEL
ALT: 47 U/L — ABNORMAL HIGH (ref 0–35)
ALT: 51 U/L — ABNORMAL HIGH (ref 0–35)
AST: 59 U/L — ABNORMAL HIGH (ref 0–37)
AST: 60 U/L — ABNORMAL HIGH (ref 0–37)
Albumin: 3.7 g/dL (ref 3.5–5.2)
Albumin: 3.8 g/dL (ref 3.5–5.2)
Albumin: 3.9 g/dL (ref 3.5–5.2)
Alkaline Phosphatase: 131 U/L — ABNORMAL HIGH (ref 39–117)
Alkaline Phosphatase: 128 U/L — ABNORMAL HIGH (ref 39–117)
BUN: 6 mg/dL (ref 6–23)
CO2: 22 mEq/L (ref 19–32)
Calcium: 9 mg/dL (ref 8.4–10.5)
Calcium: 9 mg/dL (ref 8.4–10.5)
Chloride: 101 mEq/L (ref 96–112)
Creatinine, Ser: 0.86 mg/dL (ref 0.4–1.2)
Creatinine, Ser: 0.96 mg/dL (ref 0.4–1.2)
GFR calc Af Amer: >60 mL/min (ref >60)
GFR calc Af Amer: >60 mL/min (ref >60)
GFR calc non Af Amer: >60 mL/min (ref >60)
Glucose, Bld: 117 mg/dL — ABNORMAL HIGH (ref 70–99)
Glucose, Bld: 102 mg/dL — ABNORMAL HIGH (ref 70–99)
Potassium: 3.3 mEq/L — ABNORMAL LOW (ref 3.5–5.1)
Sodium: 129 mEq/L — ABNORMAL LOW (ref 135–145)
Sodium: 134 mEq/L — ABNORMAL LOW (ref 135–145)
Total Bilirubin: 0.6 mg/dL (ref 0.3–1.2)
Total Protein: 6.9 g/dL (ref 6.0–8.3)
Total Protein: 6.9 g/dL (ref 6.0–8.3)

## 2011-03-15 LAB — CBC
HCT: 36.5 % (ref 36.0–46.0)
Hemoglobin: 12 g/dL (ref 12.0–15.0)
MCV: 75 fL — ABNORMAL LOW (ref 78.0–100.0)
Platelets: 193 10*3/uL (ref 150–400)
Platelets: 185 10*3/uL (ref 150–400)
Platelets: 235 10*3/uL (ref 150–400)
RBC: 4.93 MIL/uL (ref 3.87–5.11)
RDW: 17.5 % — ABNORMAL HIGH (ref 11.5–15.5)
RDW: 18.4 % — ABNORMAL HIGH (ref 11.5–15.5)
WBC: 10.7 10*3/uL — ABNORMAL HIGH (ref 4.0–10.5)
WBC: 8.3 10*3/uL (ref 4.0–10.5)

## 2011-03-15 LAB — URINE MICROSCOPIC-ADD ON

## 2011-03-15 LAB — DIFFERENTIAL
Basophils Absolute: 0 10*3/uL (ref 0.0–0.1)
Basophils Relative: 0 % (ref 0–1)
Eosinophils Absolute: 0.1 10*3/uL (ref 0.0–0.7)
Eosinophils Absolute: 0.2 10*3/uL (ref 0.0–0.7)
Eosinophils Relative: 1 % (ref 0–5)
Lymphocytes Relative: 6 % — ABNORMAL LOW (ref 12–46)
Lymphs Abs: 0.5 10*3/uL — ABNORMAL LOW (ref 0.7–4.0)
Lymphs Abs: 2.4 10*3/uL (ref 0.7–4.0)
Monocytes Absolute: 0.1 10*3/uL (ref 0.1–1.0)
Neutrophils Relative %: 71 % (ref 43–77)

## 2011-03-15 LAB — POCT I-STAT, CHEM 8
Glucose, Bld: 92 mg/dL (ref 70–99)
HCT: 36 % (ref 36.0–46.0)
HCT: 40 % (ref 36.0–46.0)
Hemoglobin: 12.2 g/dL (ref 12.0–15.0)
Hemoglobin: 13.6 g/dL (ref 12.0–15.0)
Potassium: 3.6 mEq/L (ref 3.5–5.1)
Potassium: 3.8 mEq/L (ref 3.5–5.1)
Sodium: 136 mEq/L (ref 135–145)
Sodium: 136 mEq/L (ref 135–145)
TCO2: 23 mmol/L (ref 0–100)

## 2011-03-15 LAB — POTASSIUM: Potassium: 3.4 mEq/L — ABNORMAL LOW (ref 3.5–5.1)

## 2011-03-15 LAB — CK: Total CK: 108 U/L (ref 7–177)

## 2011-03-16 LAB — COMPREHENSIVE METABOLIC PANEL
AST: 14 U/L (ref 0–37)
Albumin: 3.7 g/dL (ref 3.5–5.2)
Alkaline Phosphatase: 109 U/L (ref 39–117)
Chloride: 104 mEq/L (ref 96–112)
GFR calc Af Amer: >60 mL/min (ref >60)
Potassium: 4.1 mEq/L (ref 3.5–5.1)
Sodium: 136 mEq/L (ref 135–145)
Total Bilirubin: 0.4 mg/dL (ref 0.3–1.2)
Total Protein: 6.7 g/dL (ref 6.0–8.3)

## 2011-03-16 LAB — CBC
Platelets: 314 10*3/uL (ref 150–400)
WBC: 15.2 10*3/uL — ABNORMAL HIGH (ref 4.0–10.5)

## 2011-03-20 ENCOUNTER — Ambulatory Visit (INDEPENDENT_AMBULATORY_CARE_PROVIDER_SITE_OTHER): Payer: Self-pay | Admitting: Family Medicine

## 2011-03-20 ENCOUNTER — Encounter: Payer: Self-pay | Admitting: Family Medicine

## 2011-03-20 VITALS — BP 122/83 | HR 100 | Wt 108.8 lb

## 2011-03-20 DIAGNOSIS — R209 Unspecified disturbances of skin sensation: Secondary | ICD-10-CM

## 2011-03-20 DIAGNOSIS — F319 Bipolar disorder, unspecified: Secondary | ICD-10-CM

## 2011-03-20 DIAGNOSIS — E785 Hyperlipidemia, unspecified: Secondary | ICD-10-CM

## 2011-03-20 DIAGNOSIS — R200 Anesthesia of skin: Secondary | ICD-10-CM | POA: Insufficient documentation

## 2011-03-20 LAB — CBC
HCT: 38 % (ref 36.0–46.0)
HCT: 42.3 % (ref 36.0–46.0)
Hemoglobin: 12.1 g/dL (ref 12.0–15.0)
Hemoglobin: 12.3 g/dL (ref 12.0–15.0)
Hemoglobin: 14.5 g/dL (ref 12.0–15.0)
MCHC: 32 g/dL (ref 30.0–36.0)
MCHC: 32.1 g/dL (ref 30.0–36.0)
MCHC: 32.2 g/dL (ref 30.0–36.0)
MCHC: 32.4 g/dL (ref 30.0–36.0)
MCHC: 32.5 g/dL (ref 30.0–36.0)
MCHC: 32.7 g/dL (ref 30.0–36.0)
MCV: 78.5 fL (ref 78.0–100.0)
MCV: 78.7 fL (ref 78.0–100.0)
Platelets: 333 10*3/uL (ref 150–400)
Platelets: 343 10*3/uL (ref 150–400)
Platelets: 369 10*3/uL (ref 150–400)
RBC: 4.64 MIL/uL (ref 3.87–5.11)
RBC: 4.84 MIL/uL (ref 3.87–5.11)
RBC: 5.37 MIL/uL — ABNORMAL HIGH (ref 3.87–5.11)
RDW: 16.2 % — ABNORMAL HIGH (ref 11.5–15.5)
RDW: 16.4 % — ABNORMAL HIGH (ref 11.5–15.5)
RDW: 16.8 % — ABNORMAL HIGH (ref 11.5–15.5)
WBC: 32.9 10*3/uL — ABNORMAL HIGH (ref 4.0–10.5)

## 2011-03-20 LAB — AFB CULTURE WITH SMEAR (NOT AT ARMC): Acid Fast Smear: NONE SEEN

## 2011-03-20 LAB — RAPID URINE DRUG SCREEN, HOSP PERFORMED
Amphetamines: NOT DETECTED
Cocaine: NOT DETECTED
Opiates: NOT DETECTED
Tetrahydrocannabinol: NOT DETECTED

## 2011-03-20 LAB — COMPREHENSIVE METABOLIC PANEL
AST: 21 U/L (ref 0–37)
BUN: 8 mg/dL (ref 6–23)
CO2: 27 mEq/L (ref 19–32)
Calcium: 9.9 mg/dL (ref 8.4–10.5)
Creatinine, Ser: 0.8 mg/dL (ref 0.4–1.2)
GFR calc Af Amer: >60 mL/min (ref >60)
GFR calc non Af Amer: >60 mL/min (ref >60)
Total Bilirubin: 0.7 mg/dL (ref 0.3–1.2)

## 2011-03-20 LAB — GLUCOSE, CAPILLARY
Glucose-Capillary: 103 mg/dL — ABNORMAL HIGH (ref 70–99)
Glucose-Capillary: 128 mg/dL — ABNORMAL HIGH (ref 70–99)
Glucose-Capillary: 133 mg/dL — ABNORMAL HIGH (ref 70–99)
Glucose-Capillary: 141 mg/dL — ABNORMAL HIGH (ref 70–99)
Glucose-Capillary: 141 mg/dL — ABNORMAL HIGH (ref 70–99)
Glucose-Capillary: 144 mg/dL — ABNORMAL HIGH (ref 70–99)
Glucose-Capillary: 152 mg/dL — ABNORMAL HIGH (ref 70–99)
Glucose-Capillary: 153 mg/dL — ABNORMAL HIGH (ref 70–99)
Glucose-Capillary: 159 mg/dL — ABNORMAL HIGH (ref 70–99)
Glucose-Capillary: 184 mg/dL — ABNORMAL HIGH (ref 70–99)
Glucose-Capillary: 194 mg/dL — ABNORMAL HIGH (ref 70–99)

## 2011-03-20 LAB — CSF CELL COUNT WITH DIFFERENTIAL: Tube #: 4

## 2011-03-20 LAB — GLUCOSE, RANDOM: Glucose, Bld: 122 mg/dL — ABNORMAL HIGH (ref 70–99)

## 2011-03-20 LAB — ANGIOTENSIN CONVERTING ENZYME, CSF: Angio Convert Enzyme: <3 U/L (ref <16)

## 2011-03-20 LAB — BASIC METABOLIC PANEL
BUN: 9 mg/dL (ref 6–23)
CO2: 27 mEq/L (ref 19–32)
CO2: 28 mEq/L (ref 19–32)
Calcium: 8.7 mg/dL (ref 8.4–10.5)
Calcium: 9.6 mg/dL (ref 8.4–10.5)
Chloride: 105 mEq/L (ref 96–112)
Creatinine, Ser: 0.8 mg/dL (ref 0.4–1.2)
Creatinine, Ser: 0.84 mg/dL (ref 0.4–1.2)
Creatinine, Ser: 0.86 mg/dL (ref 0.4–1.2)
GFR calc Af Amer: >60 mL/min (ref >60)
GFR calc Af Amer: >60 mL/min (ref >60)
GFR calc non Af Amer: >60 mL/min (ref >60)
GFR calc non Af Amer: >60 mL/min (ref >60)
Glucose, Bld: 104 mg/dL — ABNORMAL HIGH (ref 70–99)
Sodium: 137 mEq/L (ref 135–145)
Sodium: 139 mEq/L (ref 135–145)

## 2011-03-20 LAB — HEMOGLOBIN A1C: Hgb A1c MFr Bld: 6.2 % — ABNORMAL HIGH (ref 4.6–6.1)

## 2011-03-20 LAB — LIPID PANEL
Cholesterol: 202 mg/dL — ABNORMAL HIGH (ref 0–200)
Cholesterol: 263 mg/dL — ABNORMAL HIGH (ref 0–200)
HDL: 40 mg/dL (ref >39)
LDL Cholesterol: 184 mg/dL — ABNORMAL HIGH (ref 0–99)
Total CHOL/HDL Ratio: 6.6 RATIO
Triglycerides: 196 mg/dL — ABNORMAL HIGH (ref <150)
Triglycerides: 82 mg/dL (ref <150)

## 2011-03-20 LAB — PROTEIN AND GLUCOSE, CSF: Total  Protein, CSF: 30 mg/dL (ref 15–45)

## 2011-03-20 LAB — HIV ANTIBODY (ROUTINE TESTING W REFLEX): HIV: NONREACTIVE

## 2011-03-20 LAB — OLIGOCLONAL BANDS, CSF + SERM
Albumin, CSF: 22 mg/dL (ref 0–35)
Albumin, Serum(Neph): 3290 mg/dL — ABNORMAL LOW (ref 3500–5200)
IgG, Serum: 675 mg/dL — ABNORMAL LOW (ref 768–1632)

## 2011-03-20 LAB — ANTI-NEUTROPHIL ANTIBODY

## 2011-03-20 LAB — DIFFERENTIAL
Basophils Absolute: 0 10*3/uL (ref 0.0–0.1)
Basophils Absolute: 0 10*3/uL (ref 0.0–0.1)
Basophils Relative: 0 % (ref 0–1)
Eosinophils Relative: 1 % (ref 0–5)
Lymphocytes Relative: 23 % (ref 12–46)
Lymphocytes Relative: 35 % (ref 12–46)
Lymphs Abs: 3.4 10*3/uL (ref 0.7–4.0)
Monocytes Absolute: 0.9 10*3/uL (ref 0.1–1.0)
Neutro Abs: 10.7 10*3/uL — ABNORMAL HIGH (ref 1.7–7.7)
Neutro Abs: 7.4 10*3/uL (ref 1.7–7.7)
Neutrophils Relative %: 56 % (ref 43–77)

## 2011-03-20 LAB — FUNGUS CULTURE W SMEAR

## 2011-03-20 LAB — VITAMIN B12: Vitamin B-12: 1083 pg/mL — ABNORMAL HIGH (ref 211–911)

## 2011-03-20 LAB — HEPATITIS PANEL, ACUTE
HCV Ab: NEGATIVE
Hep A IgM: NEGATIVE
Hep B C IgM: NEGATIVE

## 2011-03-20 LAB — NEUROMYELITIS OPTICA AUTOAB, IGG: NMO-IgG: NEGATIVE

## 2011-03-20 LAB — TSH
TSH: 4.543 u[IU]/mL — ABNORMAL HIGH (ref 0.350–4.500)
TSH: 6.307 u[IU]/mL — ABNORMAL HIGH (ref 0.350–4.500)

## 2011-03-20 LAB — CSF CULTURE W GRAM STAIN

## 2011-03-20 MED ORDER — ALBUTEROL SULFATE HFA 108 (90 BASE) MCG/ACT IN AERS: 2.0000 | INHALATION_SPRAY | Freq: Four times a day (QID) | RESPIRATORY_TRACT | Status: DC | PRN

## 2011-03-20 NOTE — Assessment & Plan Note (Signed)
Suspect related to hyperventilation associated with panic attacks, will recheck with next visit.  Did not find any worrisome findings.
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
## 2011-03-20 NOTE — Assessment & Plan Note (Signed)
Off statin as she is childbearing age, still having periods and mental illness that would interfere with judgement

## 2011-03-20 NOTE — Assessment & Plan Note (Signed)
Severe mental illness, had been off meds for almost one year and resumed last week, she is set to have Lithium at mental health in one month.  Recommended [facility] for counseling, Women's resource center for assistance finding a job once she feels mentally able.

## 2011-03-20 NOTE — Patient Instructions (Addendum)
[facility] Resource [address] [place], [phone]

## 2011-04-18 NOTE — Consult Note (Signed)
NAMERENATA, GAMBINO NO.:  [id number]   MEDICAL RECORD NO.:  [id number]          PATIENT TYPE:  INP   LOCATION:  3738                         FACILITY:  [facility]   PHYSICIAN:  Antonietta Breach, M.D.  DATE OF BIRTH:  1966/11/03   DATE OF CONSULTATION:  12/24/2008  DATE OF DISCHARGE:                                 CONSULTATION   Ms. Foxxhood has told staff that she knows exactly what to tell the  undersigned in order to give me a different impression than the other  impressions and assessments that the rest of the medical team is aware  of.   The staff notes are reviewed, the case is discussed directly with the  nurse.  Also, the case was discussed yesterday with the attending family  practice physician.   Today the nursing staff reported active suicidal ideation.  The patient  has displayed paranoia and ongoing memory difficulty as well as impaired  judgment.  Yesterday she was asking to get her schoolwork, then when the  nurse came to her room she accused the nurse of calling her husband  about her cat and the nurse had not done any such thing.   Today she called the nursing station asking for Tylenol, when the nurse  went down to give her Tylenol she stated that she had requested no such  thing.   Her mood has been angry today.   ASSESSMENT:  Ms. Gowens is continuing to display to the medical team  impaired memory and impaired judgment which would place her at risk for  lethal self-neglect.   Also, she has continued to have suicidal ideation and has made the  statement to the team that indicates that she would give the undersigned  different information on direct questioning.  She also manifests  paranoia.   Therefore, the undersigned does concur that Ms. Foxxhood needs inpatient  psychiatric evaluation and treatment once cleared from the general  medical hospital, in order to resolve the above mental status changes  that cause her anguish and that  would impair her functioning outside of  the hospital.  She also shows risk for suicide.      Antonietta Breach, M.D.  Electronically Signed     JW/MEDQ  D:  12/24/2008  T:  12/24/2008  Job:  [id number]

## 2011-04-18 NOTE — Op Note (Signed)
NAMESKI, POLICH NO.:  [id number]   MEDICAL RECORD NO.:  [id number]          PATIENT TYPE:  INP   LOCATION:  3004                         FACILITY:  [facility]   PHYSICIAN:  Hilda Lias, M.D.   DATE OF BIRTH:  [birth year]   DATE OF PROCEDURE:  11/16/2007  DATE OF DISCHARGE:  11/17/2007                               OPERATIVE REPORT   PREOPERATIVE DIAGNOSES:  1. C5-C6 herniated disk with compromise of the C6 nerve root.  2. 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   POSTOPERATIVE DIAGNOSES:  1. C5-C6 herniated disk with compromise of the C6 nerve root.  2. 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   PROCEDURE:  Anterior C5-6, 6-7 diskectomy, decompression of the spinal  cord. decompression of the C6-7 nerve root, interbody fusion with  allograft, plate from C5 to C7, microscope.   SURGEON:  Hilda Lias, M.D.   ASSISTANT:  Clydene Fake, M.D.   CLINICAL HISTORY:  [name] is a lady who for 1 month had been  complaining of neck pain with radiation to the right upper extremity  associated with weakness and pain.  The pain is so intense that she  barely can move her arm.  She had conservative treatment without any  improvement.  X-rays showed that she has a herniated disk at the level  of C6-C7 with foraminal narrowing secondary to osteoarthritis and  another one being at the level of 5-6, compromising the C6 nerve root.  She has some facet arthropathy at the level of C6-C7 with some foraminal  encroachment and protrusion at the level of C7-T1, but mostly to the  left side.  The patient has failed conservative treatment and surgery  was advised including the possibility of no improvement with need for  further surgery, especially because of the compromise at the 2 levels  below, infection and damage to the spinal cord.   PROCEDURE:  The patient was taken to the OR and after intubation, the  left side of the  neck was cleaned with DuraPrep.  A transverse incision  was made and the dissection was carried out to the cervical area.  X-  rays showed that indeed we were at the level of 5-6.  Then the anterior  ligament at 5-6 and 6-7 was opened.  We brought the microscope into the  area and we started at the level of 5-6.  The patient had quite a bit of  degenerative disk disease.  There was an opening in the posterior  ligament and close to the midline.  Decompression was made.  The patient  has a herniated disk almost completely adherent to the lateral aspect of  the spinal cord and the C6 nerve root.  Using the 1- and 2-mm Kerrison  punch, we were able to decompress it and at the end we had good  decompression of the spinal cord as well of both C6 nerve roots.  The  right C6 nerve root was swollen and reddish.  At the level of C6-7, we  found mostly spondylosis with some  tiny disk mostly going to the right  side.  With the drill, we drilled the foramen and we were able to  decompress both C7 nerve roots.  Having good decompression of the spinal  cord bilaterally, as well as both C6 and C7 nerve roots, the endplates  were drilled.  Then a lordotic 7-mm graft was  inserted followed by a plate using 6 screws.  Lateral cervical spine  showed good position of the bone graft and the plate.  From then on, the  area was irrigated.  Hemostasis was done with bipolar.  We waited 10  minutes just to be sure there was not any bleeding and after that, the  wound was closed with Vicryl and Steri-Strips.           ______________________________  Hilda Lias, M.D.     EB/MEDQ  D:  11/16/2007  T:  11/18/2007  Job:  [id number]
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
## 2011-04-18 NOTE — Op Note (Signed)
[name], [name]                ACCOUNT NO.:  [id number]   MEDICAL RECORD NO.:  [id number]          PATIENT TYPE:  AMB   LOCATION:  DFTL                          FACILITY:  [facility]   PHYSICIAN:  Maxie Better, M.D.DATE OF BIRTH:  [birth year]   DATE OF PROCEDURE:  05/17/2007  DATE OF DISCHARGE:                               OPERATIVE REPORT   PREOPERATIVE DIAGNOSIS:  Missed abortion, vaginal bleeding.   PROCEDURE:  Suction dilation and evacuation.   POSTOPERATIVE DIAGNOSIS:  Missed abortion, vaginal bleeding, fibroid  uterus.   ANESTHESIA:  MAC and paracervical block.   SURGEON:  Maxie Better, M.D.   ASSISTANT:  None.   INDICATIONS:  45 year old gravida 4, para 1-0-2-1, single black female  who presented with a two week history of vaginal bleeding who was found  to have a positive pregnancy test and subsequent ultrasound confirmed  intrauterine pregnancy without any fetal heart rate.  The patient was  given the option of attempted spontaneous abortion versus operative  surgical management.  The patient opted for the surgical management.  The risks and benefits of the procedure have been explained to the  patient.  Consent was signed. The patient was transferred to the  operating room.   DESCRIPTION OF PROCEDURE:  Under adequate monitored anesthesia, the  patient was placed in the dorsal lithotomy position.  She was sterilely  prepped and draped in the usual fashion.  The bladder was catheterized  for a small amount of urine.  Examination under anesthesia revealed more  axial uterus with posterior fibroid palpable. No adnexal masses could be  appreciated.  20 mL of 1% Nesacaine was injected paracervically at 3 and  9 o'clock.  The anterior lip of the cervix was grasped with a single-  tooth tenaculum. Blood was coming from the cervical os.  The cervical os  was serially dilated up to #29 [facility] dilator.  A #7 mm curved suction  cannula was introduced into the uterine  cavity.  A moderate amount of  products of conception was obtained.  The cavity was curetted, suctioned  and curetted until all tissue was felt to have been removed at which  time all instruments were removed from the vagina.  Specimen labeled  products of conception was sent to pathology.  Estimated blood loss was  minimal.  Complication was none.  The patient tolerated the procedure  well and was transferred to recovery in stable condition.      Maxie Better, M.D.  Electronically Signed     [place]/MEDQ  D:  05/17/2007  T:  05/18/2007  Job:  [id number]

## 2011-04-18 NOTE — Discharge Summary (Signed)
NAMEKATTLEYA, [name] NO.:  [id number]   MEDICAL RECORD NO.:  [id number]          PATIENT TYPE:  INP   LOCATION:  3738                         FACILITY:  [facility]   PHYSICIAN:  Nestor Ramp, MD        DATE OF BIRTH:  February 18, 1966   DATE OF ADMISSION:  12/16/2008  DATE OF DISCHARGE:                               DISCHARGE SUMMARY   This is an update to the previous dictation done yesterday, and the  patient has been stable in the hospital.  She will finish her final dose  of IV Solu-Medrol at 1800 tonight on December 23, 2008.  Then, she will  be ready for discharge.  It was then decided that the patient will need  to go to the [facility], and that arrangement will be made  as soon as a bed becomes available.  The patient's discharge medications  remain the same.   1. Seroquel 50 mg p.o. every night.  2. Cymbalta 60 mg p.o. daily  3. Elavil 50 mg p.o. every night; there will need to be an enalapril      level checked in 10 days.  4. Protonix 40 mg p.o. daily; this is the 1 medication that has      changed.  5. Prednisone, and this is a complicated prednisone taper of 260 mg      b.i.d. times 1 day, 220 mg b.i.d. for 1 day, then 180 mg b.i.d. for      1 day, then 140 mg b.i.d. for 1 day, then 100 mg b.i.d. for 1 day,      then 60 mg b.i.d. for 1 day, then 60 mg daily for 1 day, then 40 mg      daily for 1 day, then 20 mg daily for 1 day, then 10 mg daily for 1      day and then done.   The medication that this patient is suppose to stop is Elavil 150 mg  p.o. every night.  Dr. Jeanie Sewer suggested taking this patient down off  of the Elavil and checking levels in 10 days.   The patient at this time is medically stable and ready for discharge to  the [facility].  She had no new complaints.  Her final CSF  culture did come back showing no organisms, and she had of an ACE level  that was less than 3.  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   She  will be followed up by Dr. Terrace Arabia in [facility].  She  already has an appointment with them and will be February 18, which is  Thursday, at 9:15 a.m.  The patient is instructed to keep this  appointment.  The phone number there is [phone].  The patient will also  need to follow up with her PCP, Dr. Deirdre Peer, upon discharge from the  [facility]. That will need to be arranged by the  [facility].      Jamie Brookes, MD  Electronically Signed      Nestor Ramp, MD  Electronically Signed  AS/MEDQ  D:  12/23/2008  T:  12/23/2008  Job:  [id number]   cc:   Levert Feinstein, MD

## 2011-04-18 NOTE — Discharge Summary (Signed)
NAMEDETRA, BORES NO.:  [id number]   MEDICAL RECORD NO.:  [id number]          PATIENT TYPE:  IPS   LOCATION:  0303                          [facility]   PHYSICIAN:  Jasmine Pang, M.D. DATE OF BIRTH:  [birth year]   DATE OF ADMISSION:  02/04/2009  DATE OF DISCHARGE:  02/06/2009                               DISCHARGE SUMMARY   IDENTIFICATION:  This is a 45 year old separated female who was admitted  on a voluntary basis on February 04, 2009.   HISTORY OF PRESENT ILLNESS:  The patient presented yesterday tearful and  distraught as a walk-in.  She was brought by a Child psychotherapist.  She  stated she did not know where her daughter was since she had not gotten  off the bus from school.  She stated that she was unable to live without  her daughter and did not seem to know how to go about looking for her.  She was also agitated, tearful, and complaining of a headache.  That was  10/10 on a pain scale.  After a brief search, her daughter was located  at classes that she had been scheduled to attend.  She apologized to her  mother and indicated she should have told her where she was.  The  patient herself today gives a rather complicated story of the  arrangements of the care of her daughter between her and her ex-husband.  She seems unclear on why she did not know where her daughter was.  She  is denying any suicidal thoughts today, but admits that she would be  suicidal if she could not find her daughter.  Child care arrangements  seeing a bit unclear.  She has no dangerous thoughts for hurting anyone  else.   PAST PSYCHIATRIC HISTORY:  The patient is currently followed as an  outpatient by Dr. Hortencia Pilar at [facility].  She  sees some type of case manager that is assisting her in child care.  She  endorses that social services has been involved in her family, but is  unable to articulate exactly why or what the current situation is.  She  is provided [place] with a list of medications and states she takes them as  scheduled.  She denies substance abuse.  Prior hospitalizations are not  clear.  Note, the patient was admitted to our service January 21 to  December 29, 2008.  At this time, for increasing emotional crisis.  She  was also suicidal and possible hallucinations.  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  She was admitted to rule out a steroid-  induced psychosis and ultimately was diagnosed with schizoaffective  disorder.  She was discharged at that time on her current medication  regimen.   FAMILY HISTORY:  The patient denies any family history of mental illness  or substance abuse.   PAST MEDICAL HISTORY:  The patient's primary care Oree Mirelez is Dr. Deirdre Peer  who is treating her for headaches and anxiety.  CURRENT MEDICATIONS:  1. Cymbalta 60 mg daily.  2. Xanax 1 mg 3 times a day.  3. Seroquel 50 mg at bedtime.  4. Risperdal 1 mg at bedtime.  5. Amitriptyline 1 mg at bedtime.   DRUG ALLERGIES:  PENICILLIN.   PHYSICAL FINDINGS:  Physical exam was done in the emergency room and is  noted in the record.  She was seen in the emergency room because of her  complaint of a headache 10/10.  She was given dexamethasone, Reglan,  Benadryl, and Toradol for symptom relief along with IV fluids and felt  much better.  She was transferred back to our unit.   DIAGNOSTIC STUDIES:  TSH was 1.825.  Comprehensive metabolic panel was  unremarkable.  CBC showed WBC was 15.2, hemoglobin 11.7, hematocrit  35.7, platelets 314,000, and MCV 77.2.   HOSPITAL COURSE:  Upon admission, the patient was continued on her home  medications on Cymbalta 60 mg daily, Seroquel 50 mg at h.s., Ambien 10  mg p.o. q.h.s. p.r.n. insomnia, and alprazolam 1 mg p.o. t.i.d. p.r.n.  anxiety.  She was also started on Risperdal 1 mg p.o. q.h.s. and  Seroquel 50 mg p.o. q.6 h.  p.r.n. agitation.  In individual sessions  with me, the patient was initially reserved, but cooperative.  She  stated she had been worried about her 8 year old daughter.  She became  upset and was referred to [place] by Department of Social Services.  She  stated she is not suicidal and wants to go home.  Her mood was somewhat  depressed and anxious with consistent affect.  No evidence of psychosis  or thought disorder.  On February 06, 2009, sleep was good.  Mood was less  depressed, less anxious.  Affect was consistent with mood.  No suicidal  or homicidal ideation.  No thoughts of self-injurious behavior.  No  auditory or visual hallucinations.  No paranoia or delusions.  Thoughts  were logical and goal-directed.  Thought content, no predominant theme.  Insight fair.  Judgment fair.  Impulse control fair.  The patient wanted  to go home and it was felt to be safe for discharge today.   DISCHARGE DIAGNOSES:  Axis I:  Schizoaffective disorder, depressed type.  Axis II:  None.  Axis III:  Headache, resolved.  Axis IV:  Severe (problems with primary support group, burden of  psychiatric illness, other psychosocial problems).  Axis V:  Global assessment of functioning was 50 upon discharge.  GAF  was 42 upon admission.  GAF highest past year was 60-65.   DISCHARGE PLANS:  There was no specific activity level or dietary  restrictions.   POSTHOSPITAL CARE PLANS:  The patient will go to [facility] on  March 23 at 3 p.m. and will go to the [facility] on February 06, 2009 for community support with [employer].   DISCHARGE MEDICATIONS:  1. Cymbalta 60 mg daily.  2. Seroquel 50 mg at bedtime.  3. Risperdal 1 mg at bedtime.  4. Xanax as directed by her doctor.  5. Amitriptyline as directed by her doctor.      Jasmine Pang, M.D.  Electronically Signed     [facility]/MEDQ  D:  02/17/2009  T:  02/18/2009  Job:  [id number]

## 2011-04-18 NOTE — H&P (Signed)
[name], [name] NO.:  [id number]   MEDICAL RECORD NO.:  [id number]          PATIENT TYPE:  IPS   LOCATION:  0303                          [facility]   PHYSICIAN:  Jasmine Pang, M.D. DATE OF BIRTH:  [birth year]   DATE OF ADMISSION:  02/04/2009  DATE OF DISCHARGE:  02/04/2009                       PSYCHIATRIC ADMISSION ASSESSMENT   TIME SEEN:  The time of the assessment is 8:35 A.M.   IDENTIFICATION INFORMATION:  The patient is a 45 year old female.  This  is a voluntary admission.   HISTORY OF THE PRESENT ILLNESS:  The patient presented yesterday tearful  and distraught as a walk-in patient brought by a Child psychotherapist.  She  stated that she did not know where her daughter was since she had not  gotten off the bus from school.  She stated that she was unable to live  without her daughter and did not seem to know how to go about looking  for her.  She was also agitated, tearful and complaining of a headache  that was at 10/10 on a pain scale.  After a brief search her daughter  was located in classes that she had been scheduled to attend.  She  apologized to her mother and indicated that she should have told her  where she was.  The patient herself today gives a rather complicated  story of the arrangements of the care of her daughter between her ex-  husband and herself, and seems to be unclear on why she did not know  where her daughter was.  She is denying any suicidal thoughts today, but  admits that she is suicidal if she can find the daughter; and, child  care arrangements seem to be a bit unclear.  She has no dangerous  thoughts for hurting anyone else.   PAST PSYCHIATRIC HISTORY:  The patient is currently followed as an  outpatient by Dr. Hortencia Pilar at [facility] and sees  some type of a case manager that is assisting her in child care.  She  endorses that Social Services has been involved in her family, but is  unable to  articulate exactly why or what the current situation is.  She  has provided [place] with a list of medications and states she takes them as  scheduled.  She denies substance abuse.  Prior hospitalizations are not  clear.   Note, the patient was admitted to our service January 21-26, 2010 at  that time for increasing emotional crisis, suicide risk and possible  hallucinations.  At that time she was on a steroid taper due to an optic  neuritis that left her with some impaired vision in her left eye  She  was admitted to rule out a steroid-induced psychosis and ultimately was  diagnosed with a schizoaffective disorder.  She was  discharged at that  time on her current medication regimen.   SOCIAL HISTORY:  The patient is separated from her ex-husband.  They  have a 81 year old daughter.  They share custody and they divide time  with the daughter from week to week; also has a grandfather and  his wife  who assists with the daughter's care.  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  She previously worked for a [employer], but has not worked in a year now.  She reports a good  relationship with a boy friend of 24 years.   FAMILY HISTORY:  The patient denies a family history of mental illness  or substance abuse.   PAST MEDICAL HISTORY:  The patient's primary care Paw Karstens is Dr. Deirdre Peer  who was treating her for headaches and anxiety.  Current medical  problems are headaches.   CURRENT MEDICATIONS:  1. Cymbalta 60 mg daily.  2. Xanax 1 mg three times a day.  3. Seroquel 50 mg at bedtime.  4. Risperdal 1 mg at bedtime.  5. Amitriptyline 1 mg at bedtime.   ALLERGIES:  DRUG ALLERGIES ARE TO PENICILLIN.   PHYSICAL EXAMINATION:  The physical exam was done in the emergency room  and is as noted in the record.  She was seen in the emergency room  because of her complaint of headache of 10/10.  She was given  dexamethasone, Reglan, Benadryl and Toradol for symptom  relief along  with IV fluids and felt much better; and, was transferred back to our  unit.   DIAGNOSTIC STUDIES:  TSH 1.825.  Comprehensive metabolic panel was  unremarkable.  Her CBC showed WBC 15.2, hemoglobin 11.7, hematocrit  35.7, platelets 314,000, and MCV 77.2.   MENTAL STATUS EXAMINATION:  The mental status exam reveals a fully alert  female, initially pleasant and cooperative.  Speech is normal in pace,  tone and production, and is non pressured with a circuitous pattern, and  needs to be frequently redirected.  Insight limited; gives quite a  complicated and difficult to follow story of how she was not aware of  where her daughter was and why she did not know this, and the family  arrangements.  Denies suicidal thoughts today.  Insight limited.  Judgment is not clear.  She is oriented to person, place and general  situation.  Memory appears intact.   DIAGNOSES:  AXIS I  Schizoaffective disorder, not otherwise specified.  AXIS II  Deferred.  AXIS III  Headache, resolved.  AXIS IV  Deferred.  AXIS V  Current 42; past year not known.   PLAN:  1. Contact her Child psychotherapist and Sports coach, and also a significant      other, possibly some of her family members, to hear about her      functioning and get some clarification the arrangements for child      care.  2. We are not planning on changing any medications at this time.      Margaret A. Lorin Picket, N.P.      Jasmine Pang, M.D.  Electronically Signed    [facility]/MEDQ  D:  02/05/2009  T:  02/05/2009  Job:  [id number]

## 2011-04-18 NOTE — Discharge Summary (Signed)
[name], [name] NO.:  [id number]   MEDICAL RECORD NO.:  [id number]          PATIENT TYPE:  OBV   LOCATION:  5528                         FACILITY:  [facility]   PHYSICIAN:  Zenaida Deed. Mayford Knife, M.D.DATE OF BIRTH:  September 30, 1966   DATE OF ADMISSION:  04/13/2008  DATE OF DISCHARGE:  04/14/2008                               DISCHARGE SUMMARY   ATTENDING PHYSICIAN:  Raynelle Fanning A. Mayford Knife, MD   PRIMARY CARE PHYSICIAN:  Drue Dun, M.D., [facility].   DISCHARGE DIAGNOSES:  1. Overdose of amitriptyline.  2. Borderline personality traits.  3. Chronic pain.   DISCHARGE MEDICATIONS:  The patient will resume home medications except  for amitriptyline.  Those medications include;  1. Demerol 50 mg tablet per Dr. Charlett Blake.  2. Promethazine 25 mg 1 tablet p.o. q.4 h. for nausea and vomiting per      Dr. Charlett Blake.  3. Valium 5 mg tablet 3 times a day p.r.n. per Dr. Charlett Blake.  4. Nicotine 21 mcg patches daily.  5. Celebrex 200 mg per Dr. Charlett Blake.   She was instructed to wait for a possible alternative medicines to the  amitriptyline at her next office visit here with Dr. Mikey Bussing or Dr.  Beverely Low.   CONSULTS:  Psychiatry per Dr. Geralyn Flash.   PROCEDURES:  None.   LABORATORY DATA:  Upon admission, patient had a white blood cell count  of 17, hemoglobin 12.3, and platelets of 391.  Alcohol levels less than  5.  BMP was within normal limits.  Urinalysis was essentially negative.  Urine drug screen was positive for benzodiazepines and was also positive  for tricyclics.   BRIEF HOSPITAL COURSE:  This is a 45 year old female that was initially  admitted for taking more than the prescribed dose of amitriptyline.  She  was placed for suicidal precautions.  She had a sitter present.  Throughout her hospitalization, she denied that this was a suicide  attempt.  She was seen by Psychiatry per Dr. Electa Sniff to rule that this  was not a suicide attempt, but more of acting out  gesture.  He also  described that she had some borderline personality traits and  recommended that she follow up with Dr. Mikey Bussing on Friday at 8:45 a.m.  at [facility].  She did that in criteria for  involuntary commitment.  So at this time, she will be discharged in  stable condition, nonsuicidal, but held on the amitriptyline medication.   DISCHARGE INSTRUCTION:  She was instructed to not take the  amitriptyline, as we have held on to this medication bottle.  She is to  follow up with Dr. Mikey Bussing at  [facility] on Friday,  Apr 17, 2008, at 8:45 a.m.  She also has a followup appointment with Dr.  Beverely Low in place of Dr. Deirdre Peer on Apr 30, 2008, at 10 a.m.      Marisue Ivan, MD  Electronically Signed      Zenaida Deed. Mayford Knife, M.D.  Electronically Signed    KL/MEDQ  D:  04/14/2008  T:  04/15/2008  Job:  [id number]   cc:   Dr. Nicola Girt, M.D.

## 2011-04-18 NOTE — H&P (Signed)
[name], ALONGE NO.:  [id number]   MEDICAL RECORD NO.:  [id number]          PATIENT TYPE:  INP   LOCATION:  3010                         FACILITY:  [facility]   PHYSICIAN:  Hilda Lias, M.D.   DATE OF BIRTH:  1966-05-05   DATE OF ADMISSION:  11/18/2007  DATE OF DISCHARGE:                              HISTORY & PHYSICAL   [name] is a lady who was admitted to the hospital December 15th after  she was discharged December 14th.  This lady underwent anterior cervical  diskectomy at level of C6-C7 and C5-6. [name]  Patient felt great and she  wanted to go home.  Nevertheless, at home, she developed nausea and  vomiting and she came back to the emergency room and admitted to control  the nausea and vomiting.   PAST MEDICAL HISTORY:  Anterior cervical diskectomy, December 13th.   FAMILY HISTORY:  Unremarkable.   REVIEW OF SYSTEMS:  Positive for nausea and vomiting.   PHYSICAL EXAMINATION:  HEAD, EARS, NOSE AND THROAT:  Normal.  NECK:  She has had adequate range of motion of the cervical spine.  This  is a scar anteriorly.  LUNGS:  Clear.  HEART:  Sounds were normal.  ABDOMEN:  Normal.  EXTREMITIES:  Normal pulses.  NEURO:  Stable.  Normal.   CLINICAL IMPRESSION:  1. Nausea and vomiting.  2. Status post cervical fusion of 5-6 and 6-7.   RECOMMENDATIONS:  The patient is being admitted and we are going to  proceed with IV fluid, Megace for nausea, pain control.  [name] might  remain in the hospital between 48 to 72 hours.           ______________________________  Hilda Lias, M.D.     EB/MEDQ  D:  11/19/2007  T:  11/19/2007  Job:  [id number]
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
## 2011-04-18 NOTE — Consult Note (Signed)
NAMEJANISE, [name] NO.:  [id number]   MEDICAL RECORD NO.:  [id number]          PATIENT TYPE:  INP   LOCATION:  3738                         FACILITY:  [facility]   PHYSICIAN:  Levert Feinstein, MD          DATE OF BIRTH:  10-12-1966   DATE OF CONSULTATION:  DATE OF DISCHARGE:                                 CONSULTATION   CHIEF COMPLAINT:  Headache, loss of vision on the left side.   HISTORY OF PRESENT ILLNESS:  The patient is a 45 year old right-handed  African American female.  She has a past medical history of depression  and anxiety, also admitted previously for amitriptyline overdose.  She  has a history of migraine about 4 days ago, presenting with her typical  migraine left retro-orbital sharp pain; however, about 1 day into the  headache, she developed this blurry vision on her left eye.  The second  day, it has increased blurriness and also color washed out.  She denies  altitude change in her visual field.  On day #3 when she woke up, her  left visual field went totally dark.  She reported she cannot count  fingers, cannot even sense the light anymore.  She continued to have  left eye soreness and like a pencil sticking through her left eye came  out from her left occipital region.   Otherwise, denies lateralized motor sensory deficit or language  difficulty.   Recent MRI of the brain dated September 30, 2008, reviewed, which was  essentially normal.  MRI of the cervical same day has demonstrated  status post cervical change, anterior fusion of C5-6 and C7, no cord,  canal stenosis.   REVIEW OF SYSTEMS:  Pertinent as above.   PAST MEDICAL HISTORY:  Depression, anxiety, and migraine headaches.   PAST SURGICAL HISTORY:  Cervical fusion.   SOCIAL HISTORY:  She previously worked as a Health visitor, but on  disability since her cervical fusion surgery at the end of 2008.  Does  smoke.  Denied drinking or illicit drug use, and has one daughter.   FAMILY  HISTORY:  Father has suffered coronary artery disease and stroke.   ALLERGIES:  No known drug allergy.   HOME MEDICATIONS:  Cymbalta and amitriptyline.   PHYSICAL EXAMINATION:  VITAL SIGNS:  Temperature 98.4, blood pressure  117/79, and heart rate of 112.  CARDIAC:  Regular rate and rhythm.  PULMONARY:  Clear to auscultation bilaterally.  NECK:  Supple.  No carotid bruits.  NEUROLOGIC:  She is awake, alert, and oriented to history taking and  casual conversation.  Cranial nerves II through XII.  Left pupil was 1  mm enlarged compared to the right side.  Both reactive to light.  Right  fundi was sharp.  Left fundi has demonstrated pupillary edema with  retinal vein engorgement.  She does not have light sensation on the left  eye.  There was indeed left afferent pupillary defect.  Cornea reflex  was normal and symmetric.  Facial sensation and strength was normal.  Uvula and tongue midline.  Head turning and shoulder shrugging were  normal and symmetric.  Tongue protrusion into cheek.  Strength was  normal.  Motor examination:  Normal tone, bulk, and strength.  Deep  tendon reflexes normal symmetric.  Plantar responses were flexor.  Coordination:  Normal finger-to-nose and heel-to-shin.  Gait was narrow-  based and steady.   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  The other possibility would be sarcoidosis,  syphilis, and human immunodeficiency virus.  Plan is as follow:  1. MRI of the brain and left orbit with and without contrast .  2. Lab evaluation, complete vasculitis workup.  3. Lumbar puncture.  4. Consider IV Solu-Medrol treatment after above workup.      Levert Feinstein, MD  Electronically Signed     YY/MEDQ  D:  12/17/2008  T:  12/18/2008  Job:  [id number]

## 2011-04-18 NOTE — Discharge Summary (Signed)
NAMEMAKEISHA, JENTSCH NO.:  [id number]   MEDICAL RECORD NO.:  [id number]          PATIENT TYPE:  INP   LOCATION:  3738                         FACILITY:  [facility]   PHYSICIAN:  Nestor Ramp, MD        DATE OF BIRTH:  11-Sep-1966   DATE OF ADMISSION:  12/16/2008  DATE OF DISCHARGE:                               DISCHARGE SUMMARY   ADDENDUM:  I just completed a discharge summary on this patient.  The  report number is [id number].  That was to be a STAT dictation, and I failed  to press the button to make it a STAT.  Please enter this as a STAT  dictation and have it done so that she could possibly go to placement  tomorrow morning.      Jamie Brookes, MD  Electronically Signed      Nestor Ramp, MD  Electronically Signed    AS/MEDQ  D:  12/22/2008  T:  12/22/2008  Job:  [id number]

## 2011-04-18 NOTE — Discharge Summary (Signed)
NAMEJERMEKA, [name] NO.:  [id number]   MEDICAL RECORD NO.:  [id number]          PATIENT TYPE:  INP   LOCATION:  3738                         FACILITY:  [facility]   PHYSICIAN:  Nestor Ramp, MD        DATE OF BIRTH:  09/05/1966   DATE OF ADMISSION:  12/16/2008  DATE OF DISCHARGE:  12/22/2008                               DISCHARGE SUMMARY   PRIMARY CARE Greco Gastelum:  Drue Dun, MD of [facility]   DISCHARGE DIAGNOSES:  1. Optic neuritis.  2. Psychotic disorder, not otherwise specified.  3. Mood disorder, not otherwise specified.  4. Depression.  5. Gastroesophageal reflux disease.  6. Tobacco abuse.   CONSULTATIONS:  The patient had an Ophthalmology evaluation when down in  the emergency department.  This was done by Dr. Aura Camps, contact  number [phone].  The patient also was seen by Neurology, the  physician was Levert Feinstein, Neurology physician consult done on December 17, 2008, contact for this physician, [facility], phone  number [phone].  Third consult was done by Dr. Jeanie Sewer on December 21, 2008.   PROCEDURES:  The patient was seen on January 13 and had a head CT  without contrast done that showed a stable head CT with no acute  intracranial abnormality.  On January 14, she had an MRI of the brain  done and showed no acute infarct.  She has an abnormality involving the  left optic nerve that was described as enlargement with diffuse  enhancement and altered signal intensity of the left optic nerve.  It  was also describing a shaggy enhancement of the left optic sheath, this  represents a change when compared with the September 30, 2008 magnetic  resonance scan.  Given the change in a short period of time and the  acute onset of the patient's symptoms, these findings are more likely  related to the optic neuritis and then a meningioma.  This can be  confirmed on followup after treatment.  Other  considerations include  infectious neuritis or idiopathic perineuritis, glioma cell unlikely.  The extraocular muscles and lacrimal glands appear symmetric, very  minimal asymmetry of the superior ophthalmic vein, mild impression upon  the posterior aspect of the left global optic nerve abnormality.  Otherwise, the globes appear intact.  The patient was found to have a 6  mm left frontal calcified meningioma that is suspected to not have  associated mass effect.  She was found to have no acute infarct and no  significant narrowing of the vasculature of the anterior circulation.  She has a mild irregularity of the posterior circulation of the small  right pica and mild irregularity of the posterior cerebral artery and  superior cerebellar artery branches.  No aneurysm is detected.  The  patient on December 18, 2008 had a fluoro-guided needle placement lumbar  puncture that was successful and collected 12 mL of clear cerebrospinal  fluid.  The patient had multiple lab levels drawn during this  hospitalization looking for reason for her optic neuritis on December 16, 2008.  She  had a CBC that showed a white blood cell count of 13.7,  hemoglobin of 13.6 and platelet count of 343.  She also had a complete  metabolic panel that was completely normal.  The lipid profile that was  done on December 17, 2008 showed a cholesterol of 263, triglycerides of  196, HDLs of 40 and LDL of 184.  The patient had a TSH that was high at  6.307.  She had an erythrocyte sedimentation rate of 17, which was  normal.  She had IgG immunoglobulins drawn on December 17, 2008 that were  843, which was normal.  She had a urine drug screen that was positive  for benzodiazepines, which she had been getting here in the hospital,  otherwise insignificant.  She had a vitamin B12 level that was 1083,  which is high and she had a C-reactive protein that came back little bit  elevated at 13.6.  The patient had an HIV antibody that  was nonreactive  and RPR syphilis screen that was also nonreactive.  She had blood  homocystine levels drawn that were normal at 9.2.  She also had a  hemoglobin A1c that was drawn that was 6.2.  She had a folate level of  1271, which was high.  She had an anti-nuclear antibody that was  negative.  Her CSF studies showed glucose of 105 and protein of 30.  On  December 18, 2008, she also had an acute hepatitis panel drawn, which was  completely negative for any core antigen or antibody.  The anti-DNA  testing was negative.  She had an AFB culture and smear that showed no  acid-fast bacilli.  She also had a CSF fungus smear and culture that  showed no yeast or fungal elements and the culture is still in progress  and will be for the next week and half.  She had a PAN-ANCA that was  drawn.  Those results are seen on a separate report.  The patient also  had a VDRL that was also sent out, it was found to be nonreactive.  She  did have a test on December 18, 2008 to look for oligoclonal band, which  was negative as well.  A culture done on the CSF came back on December 18, 2008 showing no growth for the last 3 days.   BRIEF HOSPITAL COURSE:  This is a 45 year old African American female  that has a past medical history of depression and anxiety and a history  of migraines that presented to the ED with atypical migraine also with  left retro-orbital sharp pain.  She also had been developing blurry  vision in her left eye, which had started at the lateral corner and had  progressively moved medially.  The patient reported to not be able to  count fingers or sense light.  She did have an eye soreness and eye pain  on physical exam.  On initial physical exam, the patient did have  sluggish eye movement to bright light throughout the course of her  hospitalization, and the patient did lose any movement in that eye when  challenged with light.  The patient upon discharge still claims to see  nothing  through her left eye, but says that she can see fine and manage  through her right eye, she can manage to read and plans on going back to  the school.  There were no other significant findings on neurologic  exam.  The initial ophthalmology exam showed  that the patient had  papillitis and the original ophthalmology account indicated that the  patient has greater than 90% recovery of spontaneous visual acuity loss.  The patient was found on MRI to have changes in the left optic nerve.  The patient was diagnosed with optic neuritis and although the MRI and  lumbar puncture did not show any significant cause, there is still  question of possible multiple sclerosis as this is often likely the  initial presenting symptoms of multiple sclerosis.  1. Psychiatric changes.  The patient initially had a normal affect in      response to the changes that she was going through, but throughout      the course of the hospitalization the patient continued to get      worse.  She seemed to show impaired judgment, definitely had      impaired memory.  The patient would wander around in the hospital.      She would say phrases like I am eating crackers in my care right      now.  The patient did not remember where her car was.  The patient      has a history of psychiatric disorders and has been hospitalized in      a behavioral health center in the past.  At one point, the patient      mentioned to the neurologist Dr. Terrace Arabia that she felt the same way      that she did before she tried to overdose once, so the patient was      placed on one-to-one sitter during the hospitalization.  Dr.      Jeanie Sewer initially saw the patient before she mentioned her      possibility of suicidal thoughts and will be reassessing the      patient either tonight or tomorrow morning.  It is felt that the      patient is likely not safe to go home unless things change this      evening.  The patient seems to have lack of  understanding and seems      to be at risk for passive lethal self-neglect when in outpatient      world.  Our plan at this time is to likely send her to behavioral      health, pending Dr. Providence Crosby recommendation.  2. GERD.  The patient was continued on Pepcid, which is her home      medication.  3. Tobacco abuse.  The patient was continued on a nicotine patch while      she was inpatient.   DISCHARGE INSTRUCTIONS:  The patient is to continue on the prescriptions  Cymbalta 60 mg p.o. daily and Elavil 50 mg p.o. at bedtime.  The patient  is also, during this hospitalization, was started on Seroquel 50 mg p.o.  at bedtime and the patient is to complete a complex prednisone taper of  260 mg p.o. b.i.d. followed by 220 mg p.o. b.i.d. followed by 180 mg  p.o. b.i.d. followed by 140 mg p.o. b.i.d. followed by 100 mg p.o.  b.i.d. followed by 60 mg p.o. b.i.d. followed by 60 mg p.o. daily  followed by 40 mg p.o. daily followed by 20 mg p.o. daily followed by 10  mg p.o. daily.  It is felt that the patient at this time would not be  able to follow this complex prednisone taper as well.  Discharge  instructions for this patient include following up with  Dr. Levert Feinstein,  Neurology, followup needs to happen within 3 weeks.  The phone number  there is [phone].  The patient also needs to follow up with the  ophthalmologist, Aura Camps, contact [phone] also in 2-3  weeks.  The patient will need to follow up with her PCP as well, Dr.  Drue Dun in 2-3 weeks or upon discharge from behavioral health.  The  patient has pending labs and studies that are in the computer.  She has  angiotensin-converting enzyme pending, final result on her AFB with  smear culture, and final result on her fungus and smear culture from  CSF.  She also has a pending neuromyelitis optica autoantibody IgG and  an amitriptyline level that are pending that will need followup upon  discharge.  The patient does not  have followup appointments as of yet  due to her unknown status at this time whether she will be inpatient or  be going home, thus the patient's other appointments will be made before  discharge if she goes home.   DISCHARGE CONDITION:  The patient is medically stable and ready for  discharge to behavioral health should Dr. Jeanie Sewer agree with this  plan.      Jamie Brookes, MD  Electronically Signed      Nestor Ramp, MD  Electronically Signed    AS/MEDQ  D:  12/22/2008  T:  12/23/2008  Job:  [id number]   cc:   Tyrone Apple. Karleen Hampshire, M.D.  Levert Feinstein, MD  Dr. Jeanie Sewer

## 2011-04-18 NOTE — Discharge Summary (Signed)
NAMEEVAGELIA, KNACK NO.:  [id number]   MEDICAL RECORD NO.:  [id number]          PATIENT TYPE:  IPS   LOCATION:  0508                          FACILITY:  [facility]   PHYSICIAN:  Anselm Jungling, MD  DATE OF BIRTH:  06-26-1966   DATE OF ADMISSION:  12/24/2008  DATE OF DISCHARGE:  12/29/2008                               DISCHARGE SUMMARY   IDENTIFYING DATA AND REASON FOR ADMISSION:  The patient is a 45 year old  unmarried African American female admitted due to increasing suicide  risk, hallucinations, and emotional crisis.  Please refer to the  admission note for further details pertaining to the symptoms,  circumstances and history that led to her hospitalization.  She was  given an initial Axis I diagnoses of rule out mood disorder, rule out  psychosis not otherwise specified, rule out steroid-induced psychosis,  and rule out conversion disorder.   MEDICAL AND LABORATORY:  The patient had been medically treated just  prior to admission for optic neuritis that had led to some blindness in  one eye.  She had been treated with a steroid bolus and taper that she  was still on at the time of her admission to [place].  Because of this, there  was concern that the patient was having a steroid-induced psychosis  and/or mood disorder.  However, we later learned that the patient had a  significant psychiatric history.  The patient's level of insight  appeared to be limited, and she was cooperative, although not  necessarily in agreement with the need for inpatient treatment.  She had  been attending college, and was in the final year of her college  program, and felt an urgent need to get back to school to finish her  course work.  She also has a 64-month-old daughter that she indicated  that she wanted to get back to caring for.  The daughter, in the  meantime, was staying with her parents.   The patient was treated with a psychotropic regimen that included  Seroquel,  Cymbalta, Elavil, Risperdal, and Xanax.  The patient was  agreeable to having her family contacted.  The case manager worked  closely with the patient and was in contact with the child care worker  that had been involved with the patient's case and a close friend of the  patient's, who she gave [place] permission to contact.   The patient's thinking and cognition appeared to become progressively  more clear, well organized, and insightful over the course of her  inpatient stay.  Based upon on the collateral information that we had  received, it appeared that she had improved to the point that she was at  least close to her baseline level of functioning and thinking.  As such,  she was discharged on the sixth hospital day.   AFTERCARE:  The patient was to follow up at [facility] with an appointment to see their psychiatrist on January 07, 2009  at 11:30 a.m.  She was also to meet with Elita Quick at the [facility] of [place], with an appointment on Friday, January 01, 2009 at  11 a.m.  The patient was also to have followup with Dr. Terrace Arabia for medical  care.   DISCHARGE MEDICATIONS:  Seroquel 50 mg q.h.s., Cymbalta 60 mg daily,  Protonix 40 mg daily, Elavil 50 mg q.h.s., Risperdal 1 mg q.h.s. and  Xanax 0.5 mg t.i.d.   DISCHARGE DIAGNOSES:  AXIS I:  Schizoaffective disorder not otherwise  specified.  AXIS II:  Deferred.  AXIS III:  Gastroesophageal reflux disease and history of optic  neuritis.  AXIS IV:  Stressors, severe.  AXIS V:  Global assessment of functioning on discharge 55.      Anselm Jungling, MD  Electronically Signed     SPB/MEDQ  D:  01/01/2009  T:  01/01/2009  Job:  [id number]

## 2011-04-18 NOTE — Consult Note (Signed)
Emily, [name] NO.:  [id number]   MEDICAL RECORD NO.:  [id number]          PATIENT TYPE:  EMS   LOCATION:  ED                           FACILITY:  [facility]   PHYSICIAN:  Antonietta Breach, M.D.  DATE OF BIRTH:  05-18-1966   DATE OF CONSULTATION:  05/04/2009  DATE OF DISCHARGE:                                 CONSULTATION   REQUESTING PHYSICIAN:  [facility].   REASON FOR CONSULTATION:  Psychosis, depression.   HISTORY OF PRESENT ILLNESS:  [name] is a 45 year old  female presenting to the [facility] on May 02, 2009 with  psychosis and depression.  [name] has been experiencing  approximately 10 days of progressive depressed mood.  She has frequent  periods of crying which alternate with odd behavior.  She stated that  she was going to shoot someone and shoot herself, when she presented to  the emergency room.  Today, she has had periods of staring.  At one point, she came up to the  nurse and stated that she was responding to a call from the nurse when  the nurse had actually not called her to the nursing station.  She then  stood at the nursing station for over 10 minutes, clearly responding to  internal stimuli.  She stated that the nurse had asked her something  about her socks.  [name] will vacillate between a flat stare and  then suddenly displaying a shift to an odd affect and staring up at the  ceiling, and speaking in a different tone of voice.  She clearly is  responding to internal stimulation.  She does have impaired judgment and  poor insight, except that she is aware of the severity of her  depression.  She has had ongoing general medical stress, including a  headache condition.  She also reports blindness in her left eye.  She  has continued with the stress of sharing custody with her child and her  ex-husband.  She recently has been treated with Seroquel combined with  Risperdal.  It is  unclear whether she has been taking her medication.  The medications listed on presentation were Seroquel 50 mg q.h.s. and  Risperdal 1 mg q.h.s.   PAST PSYCHIATRIC HISTORY:  In review of the past medical record, Ms.  Foxx-Wenberg does have a prior history of psychiatric treatment and please  see the discussion above.  She was admitted to the [facility] on February 04, 2009; she was discharged on February 17, 2009  with a diagnosis of schizoaffective disorder, depressed.  She was  discharged on Seroquel 50 mg q.h.s., Risperdal 1 mg q.h.s., Cymbalta 60  mg daily.  She was also given Xanax and amitriptyline; the dosages are  unknown.  She has been followed by the [facility].   FAMILY PSYCHIATRIC HISTORY:  None known.   SOCIAL HISTORY:  She has joint custody with her husband with her 9-year-  old daughter.  She does not use alcohol or illegal drugs.  Occupation:  Medically disabled.   PAST MEDICAL HISTORY:  1. Left  eye blindness.  2. Gastroesophageal reflux disease.  3. Headache.   1. History of cervical disk surgery.  2. D and C for termination of pregnancy.   ALLERGIES:  PENICILLIN, LAMICTAL.   MEDICATIONS:  The MAR is reviewed.  She is on:  1. Cymbalta 60 mg daily.  2. Seroquel 50 mg q.h.s.  3. Risperdal 1 mg q.h.s.   LABORATORY DATA:  WBC 12.1, hemoglobin 11.6, platelet count 359, sodium  139, BUN 5, creatinine 0.65, glucose 105.  Alcohol negative.  Urine  pregnancy negative.  Drug screen was positive for benzodiazepines.   REVIEW OF SYSTEMS:  Constitutional, head, eyes, ears, nose, throat,  mouth, neurologic, psychiatric, cardiovascular, respiratory,  gastrointestinal, genitourinary, skin, musculoskeletal, hematologic,  lymphatic, endocrine, metabolic all unremarkable.   EXAMINATION:  VITAL SIGNS:  Temperature 97.9, pulse 102, respiratory  rate 20, blood pressure 109/76.  GENERAL APPEARANCE:  [name] is a middle-aged female lying in a   partially reclined position in her hospital bed with no abnormal  involuntary movements.   MENTAL STATUS EXAM:  Please see the history of present illness.  She is  alert.  However, her eye contact is poor.  Her affect involves the  changes described in the history of present illness.  Her mood will  shift from flat to suddenly having tears to suddenly an odd looking up  at the ceiling.  She is clearly responding to internal stimulation.  She  is oriented to all spheres, however.  Regarding her memory, she does  have some distortion due to psychosis.  For example, please see the  discussion regarding recalling the nurse calling her, in the history of  present illness.  Fund of knowledge and intelligence are estimated to be  somewhat below that of her premorbid baseline.  Her speech is soft with  a flat prosody.  There is no dysarthria.  Thought process is involving  blocking at times and occasional illogia.  Thought content:  She is  clearly responding to internal stimulation.  She also has delusions.  Her insight is poor; judgment is impaired, except for having intact  insight for her depression symptoms.   ASSESSMENT:  AXIS I:  293.82 psychotic disorder not otherwise specified;  293.83 mood disorder not otherwise specified.  AXIS II:  Deferred.  AXIS III:  See past medical history.  AXIS IV:  General medical, primary support group.  AXIS V:  20.   [name] is at risk for suicide.  She also is at risk for lethal  self-neglect, given her impaired judgment due to her psychosis.   RECOMMENDATIONS:  1. Would admit [name] to an inpatient psychiatric unit for      further evaluation and treatment.  2. Low stimulation ego support.  3. Would continue her current trial of Risperdal 1 mg q.h.s. combined      with Seroquel 50 mg q.h.s. for anti psychosis, monitoring for any      stiffness or other extrapyramidal side effects.   This may be enough to treat her psychosis, as it  was the regimen that  was utilized at the [facility].  It is likely that  she was noncompliant.  However, if not effective, it can be adjusted  further in her inpatient psychiatric unit.  No change in the Cymbalta at  this point.      Antonietta Breach, M.D.  Electronically Signed     JW/MEDQ  D:  05/04/2009  T:  05/04/2009  Job:  [id number]

## 2011-04-18 NOTE — H&P (Signed)
NAMEJAID, QUIRION NO.:  [id number]   MEDICAL RECORD NO.:  [id number]          PATIENT TYPE:  INP   LOCATION:  5528                         FACILITY:  [facility]   PHYSICIAN:  Pearlean Brownie, M.D.DATE OF BIRTH:  September 16, 1966   DATE OF ADMISSION:  04/13/2008  DATE OF DISCHARGE:                              HISTORY & PHYSICAL   REASON FOR ADMISSION:  Overdose on amitriptyline.   HISTORY OF PRESENT ILLNESS:  The patient is a 45 year old female who  goes by the name [name] at the [facility], who was  admitted to the [facility] Teaching Service from the [facility]  Emergency Department after reportedly taking 4 amitriptyline earlier  today.  The patient's primary care Quintavius Niebuhr is Dr. Drue Dun.  Family  Practice Teaching Service was then asked to admit the patient for  medical stabilization pending psychiatric evaluation.  ED physician at  [facility] was concerned that the patient was sleepy after taking this  overdose and somewhat slow, and therefore, not safe to go home.  The  patient wanted to go home and therefore was kept under involuntary  commitment for medical stabilization.  The patient reports that she took  4 amitriptyline earlier today.  She states that this is just before  noon, but she is not completely sure.  The patient states that she  usually takes 2 amitriptyline which helps her sleep.  Normally that is  enough, but today she says that it was not enough to help her sleep, so  she took 4.  When asked why she wanted to go to sleep just before noon,  the patient states that she just wanted to and does not provide any  reason further other than that.  The patient adamantly denies that this  was an attempt to hurt herself and specified that she dared not want to  go to sleep forever.  The patient denies any history of suicidal  ideation or suicide attempts in the past, but this is disputed by the  patient's sister per the RN on  5500 tonight.  This RN spoke with the  patient's sister and says that there is a history of suicide attempt in  the past, but there is no further history in this regard.  The patient  reports that a friend named, Otilio Jefferson, heard that the police force was at  the house when she took her medication and became concerned and called  EMS.  Of note, the patient states that her ex-husband and father  currently share custody of her 33 year old daughter.  Daughter was  removed from her care approximately 6-7 days ago and it sound like this  was ordered or at least some sort of conflict resolution with the  family precipitated by Child Protective Services involvement.  CPS is  involved, at least has been at some point, but the extent to which they  are involved is not clear at this point.  Of note, the patient recently  had a cervical spine fusion after work injury in November 2008.  She had  been unable to work  since the injury took place and has been on several  different pain medications, Valium which was transitioned to Xanax at  some point in addition to the amitriptyline.  The patient states that  she has become depressed since this injury, principally from not being  able to work.  Please also note that the patient's daughter was removed  from her care here 6-7 days ago and the conflict involved apparently  revolved around concern regarding her ability to care for her daughter  given the multiple medications that the patient was taking.  The patient  describes the situation is unfair, and the patient states that people  have lied about her situation with medications.   CURRENT ALLERGIES:  The patient reports an allergy to PENICILLIN,  although she does not specify and cannot name the type of allergy.   PAST MEDICAL HISTORY:  She is status post normal spontaneous vaginal  delivery x1 in November 2008.  She suffered a work-related injury that  is being followed by Dr. Charlett Blake, and she is also  status post surgery by  Dr. Jeral Fruit on November 16, 2007.  The patient has a history of uterine  fibroids, stable on December 2008 ultrasound as compared to a June 2008  ultrasound.   PAST SURGICAL HISTORY:  The patient is status post D&C for missed  abortion June 2008.  On November 16, 2007, had an anterior C5-C6 and C6-  C7 diskectomy, decompression of the spinal cord, D&C C6-C7 nerve root  with interbody fusion, and allograft plate from Z6-X0.  This was done by  Dr. Jeral Fruit.   FAMILY HISTORY:  The patient has maternal aunt with breast cancer.  No  family history of breast cancer in first-degree relatives.  The patient  denies any psychiatric history within the family.   SOCIAL HISTORY:  The patient until recently has lived with her 14-year-  old daughter, has a serious boyfriend, smokes a pack a day.  The patient  recently lost custody of her child 6-7 days ago due to concern regarding  her medication and the patient's inability to care properly for her  child.   REVIEW OF SYSTEMS:  The patient denies chest pain, shortness of breath,  nausea, or vomiting.  She states that she would like to eat.   PHYSICAL EXAM:  VITAL SIGNS:  The patient is afebrile.  Vital signs are  stable.  Blood pressure is 132/88.  GENERAL:  No acute distress, somewhat sleepy but arousable.  Able to  interact appropriately with the exam.  EYES:  Pupils are equal, round, and reactive to light.  Extraocular  muscles are intact.  Conjunctivae pink.  Sclera is clear.  LUNGS:  Clear to auscultation bilaterally anteriorly.  Work of breathing  is unlabored.  No wheezes, rales, or rhonchi.  HEART:  Regular rate and rhythm.  No murmurs.  Normal S1 and S2.  ABDOMEN:  Positive bowel sounds, soft, nontender, and nondistended.  EXTREMITIES:  A 2+ dorsalis pedis and radial pulses.  No cyanosis,  clubbing, or edema.  SKIN:  Warm and well perfused with no rash.  PSYCHIATRY:  The patient is alert and oriented to person,  place, and  time.  She denies active suicidal ideation or history of suicidal  ideation.  Denies auditory or visual hallucinations.  Denies ideas of  reference or paranoia.  The patient does however feel like others are  trying to take her child away and is concerned that she is being setup  to fail  to keep her daughter.  The patient is hungry.  Her thought  process is logical and goal oriented.  Her speech is nonpressured.  There is no tangentiality or circumstantiality to her speech and thought  content.  Affect is flat.  Eye contact is poor.   LAB WORK:  UDS is positive for benzodiazepines.  Urine is positive for  amitriptyline.  Ethanol screen is negative.  Urinalysis is negative.  Electrolytes are within normal limits.  White blood cell count is  elevated at 17 with a normal differential including 74% neutrophils.  Hemoglobin is 12.3 and platelet count is 391,000.  EKG performed at  [facility] ED shows sinus tachycardia, but no prolonged QTC or other  arrhythmias.   ASSESSMENT AND PLAN:  The patient is a 44 year old female with history  of depression admitted for medical stabilization for psychiatric  consultation after overdose of amitriptyline that is reported to be 4  tablets.  1. Overdose.  Poison control has been contacted.  They initially      recommended charcoal in the emergency department at [facility],      but this was not done, the patient's doubt for this to be effective      at this point.  We have talked with poison control and they      recommended monitoring the patient on tele with likely medical      stabilization in light of some psychiatric evaluation in morning.      They have recommended to also check a Tylenol salicylate level.      Again, UDS is positive for benzodiazepines and PCAs.  Ethanol      screen is negative.  The biggest stress to the patient at this      point is QTC prolongation or other arrhythmias.  QTC on EKG at      [facility] Emergency  Department is 412 milliseconds.  The patient      is stable on exam, but somewhat sleepy.  2. Depression/possible suicide attempt.  Unclear, at this point, if      this was an overt attempt to self-injury.  We will call psychiatric      consult.   DISPOSITION:  Possible behavioral health, sooner admission pending their  assessment for safety.  The patient is currently here on voluntary  commitment.       Myrtie Soman, MD  Electronically Signed      Pearlean Brownie, M.D.  Electronically Signed    TE/MEDQ  D:  04/13/2008  T:  04/14/2008  Job:  [id number]

## 2011-04-21 NOTE — H&P (Signed)
NAMEKAYLIEE, ATIENZA                          ACCOUNT NO.:  [id number]   MEDICAL RECORD NO.:  [id number]                   PATIENT TYPE:  INP   LOCATION:  0454                                 FACILITY:  [facility]   PHYSICIAN:  Corwin Levins, M.D. [facility]             DATE OF BIRTH:  12-14-1965   DATE OF ADMISSION:  11/03/2003  DATE OF DISCHARGE:                                HISTORY & PHYSICAL   CHIEF COMPLAINT:  Ongoing fever and worsening delirium.   HISTORY OF PRESENT ILLNESS:  Ms. Foxx-Tomaszewski is a 45 year old black female  here for the above. I have seen her several times over the past week with  ongoing headache, then fever, and now worsening confusion and weakness with  a fall yesterday. She has had what we thought were upper respiratory tract  infection-like symptoms several days ago and was started on Ketac for which  she is now on day four, but apparently is not working. She had onset of  headache at the outset and had an MRI done October 28, 2003, with minimal  nonspecific changes and otherwise within normal limits.  Of note is that she  did have several lab tests done on October 27, 2003, with white blood cell  count of 26,000. She was otherwise relatively clinically stable and we began  outpatient antibiotics, it was not felt at that point to represent  significant severe disease requiring admission to the hospital. His  sedimentation rate at that point was 8; hemoglobin 14.4, glucose, BUN,  creatinine, LFTs, and thyroid testing all negative at that time as well.  Prior to Ketac, she had taken several days of Lexapro as well. She is here  with friends today who states she is really not able to take care of herself  at home any more. She fell yesterday and although she lives with the husband  they are in the process of separating, probably divorce, and he is not  reliable. She specifically denies any neck pain, but clearly seems more  confused and probably delirious today.   PAST MEDICAL HISTORY:   ILLNESSES:  1. Anxiety/depression.  2. Insomnia.  3. Irregular menses.  4. History of migraines.  5. Irregular menses.  6. History of migraines.  7. Recurrent low back pain.   SURGERIES:  Status post tubal pregnancy.   ALLERGIES:  Penicillin.   CURRENT MEDICATIONS:  1. Ketac 800 mg p.o. daily day #4.  2. Ambien 10  mg p.o. q.h.s.  3. Elavil 75 mg q.h.s. p.r.n.  4. Xanax 0.5 mg daily p.r.n.  5. Lexapro 10 mg daily (it is not clear whether she is really it as she just     started this last visit).  6. Phenergan 25 mg p.r.n.   SOCIAL HISTORY:  She is smoking one pack per day. Occasional alcohol.  Illicit drug use none per patient. 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 She is married with one child, but currently separated.   FAMILY HISTORY:  Negative for migraines.   REVIEW OF SYSTEMS:  Review of systems otherwise noncontributory.   PHYSICAL EXAMINATION:  VITAL SIGNS: Ms. Foxx-Kanitz is a 45 year old black  female with a blood pressure of 140/70, respirations 20, pulse 80,  temperature 101, weight 104. She is thin, but that seems to be usual weight.  GENERAL: She is confused, talks about her dog, but otherwise seems  conversant. She does follow instructions, although it is unclear whether she  really comprehends all of what is being told.  ENT: Sclerae clear. Bilateral  tympanic membranes with marked erythema, left  greater than right.  Sinus nontender. Pharynx with marked erythema.  NECK: Bilateral submandibular lymphadenopathy.  CHEST: No rales or wheezing.  CARDIAC: Regular rate and rhythm.  ABDOMEN: Soft, nontender with positive bowel sounds.  No organomegaly or  masses.  EXTREMITIES: No edema.  NEUROLOGIC: Cranial nerves II-XII intact; otherwise, nonfocal.  SKIN:  Without significant lesions.  BREASTS/PELVIC/RECTAL: Deferred per patient.   LABORATORY DATA:  None prior to visit, except as above.   ASSESSMENT/PLAN:  1.  Sinusitis/upper respiratory tract infection with fever. Apparently, not     well controlled, now with delirium of unknown etiology. Unable to care     for self. She is to be admitted. Will check blood cultures, urine     cultures, chest x-ray, and empiric IV Avelox.  2. Delirium. Questionable encephalitis versus other. Recently had MRI     negative. Consider neurology consult. Also check TSH, RPR, and     sedimentation rate, and repeat.  3. Psychiatric. Questionable significant overlay. Continue medication as is.     Consider psychiatric consult.                                               Corwin Levins, M.D. [facility]    JWJ/MEDQ  D:  11/03/2003  T:  11/03/2003  Job:  [id number]

## 2011-04-21 NOTE — Discharge Summary (Signed)
[name], [name]                          ACCOUNT NO.:  [id number]   MEDICAL RECORD NO.:  [id number]                   PATIENT TYPE:  INP   LOCATION:  0454                                 FACILITY:  [facility]   PHYSICIAN:  Rene Paci, M.D. [facility]          DATE OF BIRTH:  [birth year]   DATE OF ADMISSION:  11/03/2003  DATE OF DISCHARGE:  11/04/2003                                 DISCHARGE SUMMARY   DISCHARGE DIAGNOSES:  1. Fever with confusion secondary to viral infection, upper respiratory     tract, resolved.  No evidence of meningitis, encephalitis, pneumonia,     urinary tract infection, or other infectious etiology.  2. Depression.   DISCHARGE MEDICATIONS:  1. Avelox 400 mg p.o. daily x6 additional days to complete empiric course.  2. Other medications are as prior to admission and include:     A. Lexapro 10 mg p.o. daily.     B. Xanax 0.5 mg p.o. b.i.d. p.r.n.     C. Phenergan 25 mg p.o. q.4h. p.r.n. nausea.     D. Ultracet 1-2 p.o. q.6h. p.r.n. pain.     E. The patient is also recommended over-the-counter p.r.n. usage of        Sudafed, Afrin, Claritin, Tylenol, and Robitussin.   HOSPITAL FOLLOW-UP:  With her primary care physician, Dr. Efrain [name], for  Monday, November 09, 2003, at 1:15 p.m.   CONDITION ON DISCHARGE:  Medically stable.   BRIEF HOSPITAL COURSE:  #1 - FEVER WITH CONFUSION:  The patient is a 45-year-  old woman who has been ill with an upper respiratory tract infection for the  last several days.  She was seen by her primary care physician the week  prior to admission with the same symptoms.  She was treated empirically with  antibiotic course for a presumed sinus infection but continued to have  headache and increasing complaints of confusion and difficulty word finding.  An MRI was performed October 28, 2003, at [facility], which was negative  for any inflammation or other changes.  On reevaluation yesterday the  patient's friend was concerned that  the patient was dizzy and did not seem  to be able to care for herself.  She was admitted for further evaluation of  her fever and observation.  While hospitalized she had no recurrence of her  fever.  Laboratory tests were within normal limits except for a white count  of 15.4, which was decreased compared to previous week when white count was  26.  Chest x-ray was negative.  No confusion or other neurologic symptoms  were observed by myself or the staff during this observation period.  This  was discussed with the patient, her friend, and her husband, who all agreed  that the patient's symptoms are improved after a period of observation and  felt stable to be continued at home.  They are reminded that if she has  recurrent  fever, dizziness that is unresolved in the next several days, or  weakness that she should return to her primary care physician or the  emergency room for further evaluation.  Continue empiric Avelox as described  and, otherwise, symptomatic treatment with over-the-counter medications as  listed above.   #2 - DEPRESSION:  The patient has a long history of depression.  It should  be noted that she is currently going through a process of separation and  divorce with her husband.  There is concern from her primary care physician  that some of her above symptoms may be related to this fact, and this was  discussed with the patient and her husband, who agree that this has been a  difficult time for them.  They agree to consider a psychiatric evaluation as  an outpatient.                                               Rene Paci, M.D. [facility]    VL/MEDQ  D:  11/04/2003  T:  11/04/2003  Job:  [id number]

## 2011-04-21 NOTE — Op Note (Signed)
[name], [name]           ACCOUNT NO.:  [id number]   MEDICAL RECORD NO.:  [id number]          PATIENT TYPE:  INP   LOCATION:  3033                         FACILITY:  [facility]   PHYSICIAN:  Fayrene Fearing L. Malon Kindle., M.D.DATE OF BIRTH:  January 18, 1966   DATE OF PROCEDURE:  05/24/2006  DATE OF DISCHARGE:                                 OPERATIVE REPORT   PROCEDURE PERFORMED:  Esophagogastroduodenoscopy.   MEDICATIONS:  Cetacaine spray, fentanyl 90 mcg, Versed 10 mg IV.   ENDOSCOPIST:  Llana Aliment. Randa Evens, M.D.   INDICATIONS FOR PROCEDURE:  Persistent nausea and vomiting with negative  work-up.   DESCRIPTION OF PROCEDURE:  The procedure had been explained to the patient  and consent obtained.  With the patient in left lateral decubitus position,  the Olympus endoscope was inserted.  The stomach was entered.  The pylorus  identified and passed.  The duodenum including the bulb and second portion  were seen well and was normal.  The scope was withdrawn.  The pyloric  channel was normal as was the body of the stomach seen in the forward and  retroflex view.  The scope was withdrawn.  The distal and proximal esophagus  were endoscopically normal.  The patient tolerated the procedure well and  was maintained on low flow oxygen and pulse oximeter throughout the  procedure with no obvious problem.   ASSESSMENT:  1.  Nausea and vomiting of unclear cause with normal endoscopy.   PLAN:  Will check CT of the head.  Check a serum cortisol level.  Will  follow the patient clinically.  Hopefully can advance her diet and hopefully  she will improve.           ______________________________  Llana Aliment. Malon Kindle., M.D.     Waldron Session  D:  05/24/2006  T:  05/24/2006  Job:  [id number]

## 2011-04-21 NOTE — H&P (Signed)
NAMEDANIELLY, ACKERLEY NO.:  [id number]   MEDICAL RECORD NO.:  [id number]          PATIENT TYPE:  INP   LOCATION:  1843                         FACILITY:  [facility]   PHYSICIAN:  Sherin Quarry, MD      DATE OF BIRTH:  Apr 10, 1966   DATE OF ADMISSION:  05/16/2006  DATE OF DISCHARGE:                                HISTORY & PHYSICAL   [name] is a 45 year old lady who is generally in good health. 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  She states that on Monday she awakened and began having vomiting. The  vomiting has persisted over the subsequent two days. It has been associated  with a feeling of acid reflux and burning in her throat, and malaise and  weakness as she has not eaten anything during that two-day period.  She says  she has been passing gas, but really has not any bowel function.  She thinks  that she may have had a chill on Monday, but does not think she has had any  fevers since then. She has not had any headache, breathing difficulty, chest  pain, hematemesis, melena, or dysuria.  Her last menstrual period was June  5th and she has had a previous tubal ligation. She presented to the  emergency room last night, was treated symptomatically. At that time she was  advised  to come into the hospital, but elected to go home.  However, as her  nausea and vomiting has persisted she has returned. On arrival to the  emergency room her blood pressure is 104/68, pulse was 94, O2 saturation  99%. Potassium is 3.5, BUN 5, hemoglobin 12.6.  Since she has been in the  emergency room she has received 4 mg of IV Zofran on two occasions, but her  symptoms have persisted and decision was made to offer her admission at this  time. She lives with her daughter. No one she has been around has been sick.  There is been no travel history.   PAST MEDICAL HISTORY:  She takes no medications on a regular basis.  She is  allergic to  PENICILLIN. Operation include she has had a previous tubal  ligation. It sounds from her description like she may have had a tubal  pregnancy. Medical illnesses are, otherwise, none.  She says she will  occasionally have migraine headaches.   FAMILY HISTORY:  Notable for the patient's father who had several CABGs, she  states. Her mother died at the age of 62 of a pancreas problem.   SOCIAL HISTORY:  She smokes about 3/4's of a pack of cigarettes per day.  She does not drink alcohol.  She does not abuse drugs.  She will very  occasionally take an Advil or Tylenol for headache.   REVIEW OF SYSTEMS:  HEAD:  She has a dull headache. EAR, NOSE, THROAT:  Denies earache, sinus pain or sore throat.  CHEST:  Denies coughing,  wheezing, or chest congestion.  CARDIOVASCULAR:  Denies orthopnea, PND or  ankle edema.  GI: See above.  GU: Denies dysuria, hematuria, or  nocturia.  GYN: See above.  NEUROLOGIC: Denies history of seizure or stroke. ENDO:  Denies excessive thirst, urinary frequency, or nocturia.   PHYSICAL EXAMINATION:  GENERAL: She is a pleasant lady who appears acutely  ill.  HEENT: Within normal limits. Her throat does look somewhat reddened.  CHEST: Clear.  BACK:  No CVA or point tenderness.  CARDIOVASCULAR:  Shows normal S1-S2.  There are no rubs, murmurs or gallops.  ABDOMEN: Quite benign.  There are normal bowel sounds.  There is no masses  or tenderness.  No guarding or rebound.  NEUROLOGIC:  Testing is within normal limits.  EXTREMITIES: Within normal limits.   IMPRESSION:  1.  Nausea and vomiting times two days, probable acute gastroenteritis.  2.  Dehydration.  3.  History of tubal ligation.  4.  History of migraine headaches.   PLAN:  Admit the patient to the hospital for IV fluids, antinausea  prescription. Will put her empirically on Protonix and obtain an amylase and  lipase. Will also obtain a KUB and upright abdomen as a screening. If  symptoms persist a CT scan  of the abdomen may be indicated. I think that her  symptoms will respond to symptomatic treatment in time.           ______________________________  Sherin Quarry, MD     SY/MEDQ  D:  05/16/2006  T:  05/16/2006  Job:  [id number]

## 2011-04-21 NOTE — Discharge Summary (Signed)
[name], DOBIS NO.:  [id number]   MEDICAL RECORD NO.:  [id number]          PATIENT TYPE:  INP   LOCATION:  3033                         FACILITY:  [facility]   PHYSICIAN:  Sherin Quarry, MD      DATE OF BIRTH:  10/04/1966   DATE OF ADMISSION:  05/16/2006  DATE OF DISCHARGE:  05/28/2006                                 DISCHARGE SUMMARY   [name] is a 45 year old lady who states that on Monday prior to  admission she began experiencing persistent vomiting.  This continued for a  period of 2 days and was associated with acid reflux symptoms as well as  malaise and weakness.  The patient has been under a lot of stress recently  both because she is suppose to be taking finals at [employer] and also because  she is separated from her husband who is seeking a divorce.  There has been  no associated headache, breathing difficulty, chest pain, hematemesis or  melena.  In the emergency room, she was treated symptomatically but  initially went home but then returned and was admitted on May 16, 2006.   PHYSICAL EXAMINATION:  At the time of admission as described by Dr. Tresa Endo.  HEENT:  Exam was within normal limits.  CHEST:  Clear.  CARDIOVASCULAR:  Normal S1-S2 without rubs, murmurs or gallops.  ABDOMEN:  Benign.  There were normal bowel sounds without masses or  tenderness.  EXTREMITIES:  Normal.  NEUROLOGIC:  Normal.   Relevant laboratory studies were obtained and these have included normal  amylase and lipase on at least four determinations.  Normal urinalysis,  remarkable only for ketosis. A CMET which showed a potassium of 3.1 and  normal liver functions.  A  negative urine culture.  Thyroid studies were  normal.  Urine drug screen was negative.  Serum cortisol level was 25.5.   Consultation was obtained from Dr. Randa Evens of the [facility]  [facility], who participated in the patient's management and assisted with  recommendations and  treatments.   While in the hospital, studies obtained included KUB of the abdomen which  was normal, a CT scan of the abdomen and pelvis which was negative for intra-  abdominal pathology.  Ultrasound of the abdomen which showed some sludge in  the gallbladder, but otherwise no abnormal findings.  Gastric emptying study  which was normal.  Chest x-ray which was negative.  A CT scan of the head  which was normal.  A small-bowel follow-through which was normal.  On May 24, 2006, the patient underwent upper endoscopy.  This study was entirely  normal.   On admission to the hospital the patient was placed on IV of normal saline  with 10 mEq of KCl at 150 mL/hour.  Zofran was administered for nausea,  Protonix was given for acid reflux symptoms.  Morphine was ordered for pain.  Subsequently the Zofran was switched to Compazine and Reglan 5 mg IV every 6  hours was added to the patient's regimen.  On May 22, 2006, because the  patient was refusing to eat, Dr. Nehemiah Settle started her on TPN  and TPN was  continued until the date of discharge.  On May 25, 2006, the patient was  seen in consultation by Dr. Jeanie Sewer of the psychiatry service.  Dr.  Jeanie Sewer recommended starting her on Remeron 15 mg at bedtime. He  recommended that Lexapro be continued.  He recommended short-term use of  Ativan and did not think that the patient needed inpatient psychiatric care.  He felt that the patient's symptoms were largely secondary to anxiety.  On  the May 25, 2006, May 26, 2006, May 27, 2006, and May 28, 2006, I had  extended discussions with Ms.  [name] in which I explained that to the  best of my ability, we had thoroughly evaluated her symptoms and we could  not find any organic basis for her problem.  I told her that I would be  happy to refer her to [facility], [facility] or [facility] if she wanted to have a second opinion.  I also discussed  her status with her  father and her sister at the patient's request.  Finally  on May 28, 2006, [name] and indicated that she wished to be  discharged and did not wish to be referred anywhere.  She indicates that she  plans to follow up with [facility].  Therefore on May 28, 2006, the  patient was discharged.   DISCHARGE DIAGNOSIS:  1.  Nausea and vomiting, prolonged, etiology unknown.  2.  Secondary dehydration.  3.  Anxiety and depression.  4.  History of tubal ligation.  5.  History of migraine headaches.   DISCHARGE MEDICATIONS:  1.  Klonopin 0.5 mg b.i.d.  2.  Lexapro 10 mg in a.m. daily.  3.  Remeron 15 mg in the evening daily.  4.  Protonix 40 mg daily.  5.  Compazine 10 mg p.o. q.i.d. p.r.n. for nausea.   CONDITION AT TIME OF DISCHARGE:  Fair.           ______________________________  Sherin Quarry, MD     SY/MEDQ  D:  05/28/2006  T:  05/28/2006  Job:  [id number]   cc:   Antonietta Breach, M.D.   James L. Malon Kindle., M.D.  Fax: [phone]   [facility]
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
## 2011-04-21 NOTE — Consult Note (Signed)
[name], [name]           ACCOUNT NO.:  [id number]   MEDICAL RECORD NO.:  [id number]          PATIENT TYPE:  INP   LOCATION:  3033                         FACILITY:  [facility]   PHYSICIAN:  Fayrene Fearing L. Malon Kindle., M.D.DATE OF BIRTH:  Jan 24, 1966   DATE OF CONSULTATION:  05/21/2006  DATE OF DISCHARGE:                                   CONSULTATION   CONSULTING PHYSICIAN:  Fayrene Fearing L. Randa Evens, M.D.   REQUESTING PHYSICIAN:  Deirdre Peer. Polite, M.D.   HISTORY:  A 45 year old woman who had been in good health and approximately  a week prior to admission she developed vomiting.  She has had vague nausea  and vomiting with burning in her throat, malaise, had not eaten anything,  has been passing gas but no diarrhea associated with this.  No fever, she  says she has felt warm, if anything it has been low grade, had one chill.  No dysuria.  Her last menses finished about the same time this started and  she says she is still spotting some.  She has had 3 visits to the emergency  room and finally was admitted.  She denies any chance of being pregnant.   Her workup here in the hospital included a normal blood gas and a slightly  elevated white count of approximately 13, hemoglobin is around 11.  Electrolytes are with a potassium of 2.7 although it was normal when she  came in.  Liver function tests were basically normal.  Amylase and lipase  are normal.  TSH, urine pregnancy test were normal.  Drug screen was  negative.  Urinalysis was unremarkable.  Acute abdominal series and CT of  the abdomen and pelvis had been unremarkable.   The patient has been treated symptomatically since here and still vomiting.   CURRENT MEDICATIONS:  Protonix, Ativan, Reglan, morphine, Lexapro, dextrose,  magic mouthwash, Ensure, Compazine, Zestril, hyoscyamine.   ALLERGIES:  PENICILLIN.   MEDICAL HISTORY:  1.  No chronic medical problems other than migraines.  2.  She has also had a history of depression in the  past.   PREVIOUS SURGERY:  1.  Cyst removed from the right elbow.  2.  She has also had a tubal pregnancy on the left.  She is not sure if her      ovary was removed or not.  No other surgeries.   FAMILY HISTORY:  Mother died of some sort of pancreatic problem.  Father is  living and has heart disease.  There is no family history of cancer.   SOCIAL HISTORY:  She does not drink.  She smokes and has for several years.  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  She has used pot in the past but none  for 3-4 years.  She lives with her daughter.   REVIEW OF SYSTEMS:  Remarkable for no chronic heartburn, no chronic  diarrhea, or constipation, or abdominal pain, or nausea.   PHYSICAL EXAMINATION:  VITAL SIGNS:  Temperature 98.6, blood pressure  115/77, pulse 95, respirations 20.  GENERAL:  A  pleasant African American female in no acute distress.  EYES:  Sclerae nonicteric.  NECK:  Supple.  No lymphadenopathy.  LUNGS:  Clear.  HEART:  Regular rate and rhythm without murmurs or gallops.  ABDOMEN:  Soft, nondistended with good bowel sounds.  No localized  tenderness.   ASSESSMENT:  Nausea and vomiting of unclear cause.  It does not appear to be  a bowel obstruction or any kind of inflammatory process in the abdomen or  pelvis.  It could be due to migraines or delayed gastric emptying.  I could  also look at her gallbladder.   PLAN:  1.  We will await the results of the gastric emptying scan.  2.  We will add a gallbladder ultrasound.  3.  Make further recommendations pending those results.  4.  She may well need an endoscopy.           ______________________________  Llana Aliment. Malon Kindle., M.D.     Waldron Session  D:  05/21/2006  T:  05/22/2006  Job:  [id number]   cc:   Fayrene Fearing L. Malon Kindle., M.D.  Fax: [phone]

## 2011-09-05 LAB — CBC
Hemoglobin: 13.4
RBC: 5.19 — ABNORMAL HIGH
RDW: 17.1 — ABNORMAL HIGH
WBC: 12.7 — ABNORMAL HIGH

## 2011-09-05 LAB — DIFFERENTIAL
Basophils Absolute: 0
Lymphocytes Relative: 23
Lymphs Abs: 2.9
Monocytes Absolute: 1
Neutro Abs: 8.6 — ABNORMAL HIGH

## 2011-09-05 LAB — POCT I-STAT, CHEM 8
Calcium, Ion: 1.13
Chloride: 107
Creatinine, Ser: 0.9
Glucose, Bld: 93
HCT: 42

## 2011-09-05 LAB — URINALYSIS, ROUTINE W REFLEX MICROSCOPIC
Bilirubin Urine: NEGATIVE
Ketones, ur: NEGATIVE
Nitrite: NEGATIVE
pH: 6.5

## 2011-09-05 LAB — URINE MICROSCOPIC-ADD ON

## 2011-09-05 LAB — URINE CULTURE: Colony Count: 85000

## 2011-09-08 LAB — URINALYSIS, ROUTINE W REFLEX MICROSCOPIC
Bilirubin Urine: NEGATIVE
Glucose, UA: NEGATIVE
Ketones, ur: NEGATIVE
Leukocytes, UA: NEGATIVE
Nitrite: NEGATIVE
Protein, ur: NEGATIVE
Specific Gravity, Urine: 1.02
Urobilinogen, UA: 0.2
pH: 7.5

## 2011-09-08 LAB — CBC
HCT: 35.3 — ABNORMAL LOW
MCV: 77.5 — ABNORMAL LOW
Platelets: 283
RBC: 4.55
WBC: 6.4

## 2011-09-08 LAB — DIFFERENTIAL
Eosinophils Absolute: 0 — ABNORMAL LOW
Eosinophils Relative: 0
Lymphocytes Relative: 18
Lymphs Abs: 1.1
Monocytes Relative: 7

## 2011-09-08 LAB — I-STAT 8, (EC8 V) (CONVERTED LAB)
BUN: 12
Bicarbonate: 23.1
Chloride: 104
Glucose, Bld: 122 — ABNORMAL HIGH
HCT: 39
Hemoglobin: 13.3
Operator id: 285841
Potassium: 3.3 — ABNORMAL LOW
Sodium: 138
TCO2: 24
pCO2, Ven: 32.2 — ABNORMAL LOW
pH, Ven: 7.463 — ABNORMAL HIGH

## 2011-09-08 LAB — URINE MICROSCOPIC-ADD ON

## 2011-09-08 LAB — POCT I-STAT CREATININE
Creatinine, Ser: 0.7
Operator id: 285841

## 2011-09-08 LAB — POCT PREGNANCY, URINE
Operator id: 285841
Preg Test, Ur: NEGATIVE

## 2011-09-11 LAB — URINALYSIS, ROUTINE W REFLEX MICROSCOPIC
Glucose, UA: NEGATIVE
Hgb urine dipstick: NEGATIVE
Protein, ur: NEGATIVE
Specific Gravity, Urine: 1.017
pH: 7.5

## 2011-09-11 LAB — COMPREHENSIVE METABOLIC PANEL
ALT: 16
AST: 18
Albumin: 4.1
Alkaline Phosphatase: 85
Calcium: 9.4
GFR calc Af Amer: >60
Potassium: 4.3
Sodium: 134 — ABNORMAL LOW
Total Protein: 6.9

## 2011-09-11 LAB — CBC
MCHC: 33.9
RDW: 18.2 — ABNORMAL HIGH

## 2011-09-15 LAB — I-STAT 8, (EC8 V) (CONVERTED LAB)
Acid-Base Excess: 2
BUN: 6
Bicarbonate: 23.5
Chloride: 103
pCO2, Ven: 28.5 — ABNORMAL LOW
pH, Ven: 7.524 — ABNORMAL HIGH

## 2011-09-15 LAB — URINALYSIS, ROUTINE W REFLEX MICROSCOPIC
Ketones, ur: 15 — AB
Leukocytes, UA: NEGATIVE
Nitrite: NEGATIVE
pH: 7.5

## 2011-09-15 LAB — URINE MICROSCOPIC-ADD ON

## 2011-09-15 LAB — PREGNANCY, URINE: Preg Test, Ur: NEGATIVE

## 2011-09-18 LAB — CBC
HCT: 36.3
HCT: 38.1
Hemoglobin: 12.1
MCHC: 33.5
MCV: 76.6 — ABNORMAL LOW
Platelets: 304
RBC: 4.73
RBC: 4.8
RBC: 4.98
WBC: 11.5 — ABNORMAL HIGH
WBC: 11.6 — ABNORMAL HIGH

## 2011-09-18 LAB — POCT CARDIAC MARKERS
CKMB, poc: <1 — ABNORMAL LOW
Myoglobin, poc: 120
Myoglobin, poc: 39
Operator id: 272551
Operator id: 282201
Troponin i, poc: <0.05

## 2011-09-18 LAB — DIFFERENTIAL
Basophils Absolute: 0
Basophils Absolute: 0
Basophils Relative: 0
Basophils Relative: 0
Eosinophils Absolute: 0.1
Eosinophils Absolute: 0.1
Eosinophils Absolute: 0.1
Eosinophils Relative: 1
Lymphs Abs: 2.5
Monocytes Absolute: 0.8 — ABNORMAL HIGH
Monocytes Relative: 10
Monocytes Relative: 9
Neutro Abs: 7.7
Neutro Abs: 8.8 — ABNORMAL HIGH
Neutrophils Relative %: 67
Neutrophils Relative %: 73

## 2011-09-18 LAB — D-DIMER, QUANTITATIVE: D-Dimer, Quant: 0.41

## 2011-09-18 LAB — COMPREHENSIVE METABOLIC PANEL
ALT: 13
Alkaline Phosphatase: 83
BUN: 2 — ABNORMAL LOW
Chloride: 104
Glucose, Bld: 104 — ABNORMAL HIGH
Potassium: 3.3 — ABNORMAL LOW
Total Bilirubin: 0.6

## 2011-09-18 LAB — HEPATIC FUNCTION PANEL
Alkaline Phosphatase: 90
Bilirubin, Direct: 0.1
Indirect Bilirubin: 0.7
Total Bilirubin: 0.8

## 2011-09-18 LAB — I-STAT 8, (EC8 V) (CONVERTED LAB)
Acid-Base Excess: 5 — ABNORMAL HIGH
BUN: 7
Bicarbonate: 27.9 — ABNORMAL HIGH
Chloride: 100
Glucose, Bld: 95
HCT: 43
Hemoglobin: 14.6
Operator id: 272551
Potassium: 3.7
Sodium: 137
TCO2: 29
pCO2, Ven: 36.1 — ABNORMAL LOW
pH, Ven: 7.495 — ABNORMAL HIGH

## 2011-09-18 LAB — URINALYSIS, ROUTINE W REFLEX MICROSCOPIC
Ketones, ur: 15 — AB
Nitrite: NEGATIVE
Urobilinogen, UA: 1

## 2011-09-18 LAB — LIPASE, BLOOD: Lipase: 12

## 2011-09-18 LAB — URINE MICROSCOPIC-ADD ON

## 2011-09-18 LAB — POCT I-STAT CREATININE
Creatinine, Ser: 0.9
Operator id: 272551

## 2011-09-21 LAB — CBC
Hemoglobin: 13.5
MCHC: 32.9
RBC: 5.41 — ABNORMAL HIGH
WBC: 9.4

## 2011-09-21 LAB — ABO/RH: ABO/RH(D): O POS

## 2011-09-21 LAB — URINALYSIS, ROUTINE W REFLEX MICROSCOPIC
Bilirubin Urine: NEGATIVE
Glucose, UA: NEGATIVE
Ketones, ur: 15 — AB
Protein, ur: NEGATIVE
pH: 6

## 2011-09-21 LAB — WET PREP, GENITAL: Trich, Wet Prep: NONE SEEN

## 2011-09-21 LAB — POCT PREGNANCY, URINE
Operator id: 220991
Preg Test, Ur: POSITIVE

## 2011-09-21 LAB — GC/CHLAMYDIA PROBE AMP, GENITAL: Chlamydia, DNA Probe: NEGATIVE

## 2011-09-21 LAB — HCG, QUANTITATIVE, PREGNANCY: hCG, Beta Chain, Quant, S: 1342 — ABNORMAL HIGH

## 2011-09-21 LAB — URINE MICROSCOPIC-ADD ON

## 2011-09-21 LAB — RPR: RPR Ser Ql: NONREACTIVE

## 2011-10-06 ENCOUNTER — Other Ambulatory Visit (HOSPITAL_COMMUNITY)
Admission: RE | Admit: 2011-10-06 | Discharge: 2011-10-06 | Disposition: A | Discharge status: Home / Self Care | Payer: Medicaid Other | Source: Ambulatory Visit | Attending: Family Medicine | Admitting: Family Medicine

## 2011-10-06 ENCOUNTER — Encounter: Payer: Self-pay | Admitting: Family Medicine

## 2011-10-06 ENCOUNTER — Ambulatory Visit (INDEPENDENT_AMBULATORY_CARE_PROVIDER_SITE_OTHER): Payer: Medicaid Other | Admitting: Family Medicine

## 2011-10-06 VITALS — BP 123/76 | HR 92 | Temp 98.3°F | Ht 63.75 in | Wt 123.0 lb

## 2011-10-06 DIAGNOSIS — N760 Acute vaginitis: Secondary | ICD-10-CM

## 2011-10-06 DIAGNOSIS — F319 Bipolar disorder, unspecified: Secondary | ICD-10-CM

## 2011-10-06 DIAGNOSIS — Z20828 Contact with and (suspected) exposure to other viral communicable diseases: Secondary | ICD-10-CM

## 2011-10-06 DIAGNOSIS — F17200 Nicotine dependence, unspecified, uncomplicated: Secondary | ICD-10-CM

## 2011-10-06 DIAGNOSIS — Z124 Encounter for screening for malignant neoplasm of cervix: Secondary | ICD-10-CM

## 2011-10-06 DIAGNOSIS — N926 Irregular menstruation, unspecified: Secondary | ICD-10-CM | POA: Insufficient documentation

## 2011-10-06 DIAGNOSIS — Z0000 Encounter for general adult medical examination without abnormal findings: Secondary | ICD-10-CM

## 2011-10-06 DIAGNOSIS — Z01419 Encounter for gynecological examination (general) (routine) without abnormal findings: Secondary | ICD-10-CM

## 2011-10-06 DIAGNOSIS — N912 Amenorrhea, unspecified: Secondary | ICD-10-CM

## 2011-10-06 DIAGNOSIS — Z23 Encounter for immunization: Secondary | ICD-10-CM

## 2011-10-06 DIAGNOSIS — Z202 Contact with and (suspected) exposure to infections with a predominantly sexual mode of transmission: Secondary | ICD-10-CM

## 2011-10-06 LAB — CBC WITH DIFFERENTIAL/PLATELET
HCT: 43.3 % (ref 36.0–46.0)
Hemoglobin: 14.2 g/dL (ref 12.0–15.0)
Lymphocytes Relative: 24 % (ref 12–46)
Lymphs Abs: 3.9 10*3/uL (ref 0.7–4.0)
Monocytes Absolute: 1.2 10*3/uL — ABNORMAL HIGH (ref 0.1–1.0)
Monocytes Relative: 7 % (ref 3–12)
Neutro Abs: 10.9 10*3/uL — ABNORMAL HIGH (ref 1.7–7.7)
Neutrophils Relative %: 67 % (ref 43–77)
RBC: 4.96 MIL/uL (ref 3.87–5.11)

## 2011-10-06 LAB — COMPREHENSIVE METABOLIC PANEL
AST: 16 U/L (ref 0–37)
BUN: 8 mg/dL (ref 6–23)
CO2: 25 mEq/L (ref 19–32)
Calcium: 9.9 mg/dL (ref 8.4–10.5)
Chloride: 103 mEq/L (ref 96–112)
Creat: 0.78 mg/dL (ref 0.50–1.10)
Total Bilirubin: 0.3 mg/dL (ref 0.3–1.2)

## 2011-10-06 LAB — LIPID PANEL
Cholesterol: 242 mg/dL — ABNORMAL HIGH (ref 0–200)
HDL: 57 mg/dL (ref >39)
LDL Cholesterol: 162 mg/dL — ABNORMAL HIGH (ref 0–99)
Triglycerides: 117 mg/dL (ref <150)
VLDL: 23 mg/dL (ref 0–40)

## 2011-10-06 LAB — TSH: TSH: 4.988 u[IU]/mL — ABNORMAL HIGH (ref 0.350–4.500)

## 2011-10-06 MED ORDER — NICOTINE 7 MG/24HR TD PT24: 1.0000 | MEDICATED_PATCH | TRANSDERMAL | Status: AC

## 2011-10-06 MED ORDER — NICOTINE POLACRILEX 2 MG MT GUM: 2.0000 mg | CHEWING_GUM | OROMUCOSAL | Status: AC | PRN

## 2011-10-06 NOTE — Assessment & Plan Note (Signed)
Patient would like to quit. We will start with a nicotine patch and nicotine gum. Patient also given information on the quit line.

## 2011-10-06 NOTE — Assessment & Plan Note (Signed)
Seen by Vesta Mixer, back on medications.  Feels like she's doing "alright".  Will draw lithium level, lipid panel, CMP, TSH, CBC with diff.

## 2011-10-06 NOTE — Assessment & Plan Note (Signed)
Urine pregnancy test negative. Counseled patient that this may just be the start of menopause. No further workup needed at this time.

## 2011-10-06 NOTE — Assessment & Plan Note (Signed)
Doing well. Would like to be checked for all STDs. Patient has one partner. Counseled on smoking, drug, alcohol cessation. Counseled on need for mammogram. Flu shot given.

## 2011-10-06 NOTE — Progress Notes (Signed)
  Subjective:     [name] is a 45 y.o. female and is here for a comprehensive physical exam. The patient reports problems - irregular period, weight gain (20# in 2 month, not upset just wants to make sure it's ok).  History   Social History  . Marital Status: Divorced    Spouse Name: N/A    Number of Children: N/A  . Years of Education: N/A   Occupational History  . Not on file.   Social History Main Topics  . Smoking status: Current Everyday Smoker -- 0.8 packs/day    Types: Cigarettes  . Smokeless tobacco: Not on file   Comment: would like to love to quitt per pt  . Alcohol Use: 3.0 oz/week    5 Cans of beer per week  . Drug Use: 1 per week    Special: Marijuana  . Sexually Active: Not on file   Other Topics Concern  . Not on file   Social History Narrative  . No narrative on file   Health Maintenance  Topic Date Due  . Pap Smear  09/10/1983  . Tetanus/tdap  09/09/1984  . Influenza Vaccine  09/04/2011    The following portions of the patient's history were reviewed and updated as appropriate: allergies, current medications, past family history, past medical history, past social history, past surgical history and problem list.  Review of Systems Constitutional: negative for chills, fevers and weight loss Eyes: negative for visual disturbance Ears, nose, mouth, throat, and face: negative for nasal congestion and sore throat Respiratory: negative for asthma, cough and wheezing Cardiovascular: negative for chest pain, chest pressure/discomfort, dyspnea, exertional chest pressure/discomfort, palpitations and syncope Gastrointestinal: negative for abdominal pain, change in bowel habits, dyspepsia, nausea and vomiting Genitourinary:negative for dysuria Integument/breast: negative for breast lump, breast tenderness, dryness and nipple discharge Hematologic/lymphatic: negative for easy bruising Musculoskeletal:negative for arthralgias, back pain and stiff  joints Neurological: negative for dizziness, gait problems, headaches, memory problems and weakness Behavioral/Psych: negative for aggressive behavior, behavior problems, depression, fatigue, mood swings and sexual difficulty Endocrine: negative for temperature intolerance   Objective:    BP 123/76  Pulse 92  Temp(Src) 98.3 F (36.8 C) (Oral)  Ht 5' 3.75" (1.619 m)  Wt 123 lb (55.792 kg)  BMI 21.28 kg/m2  LMP 08/06/2011 General appearance: alert, cooperative, appears stated age and no distress Head: Normocephalic, without obvious abnormality, atraumatic Eyes: conjunctivae/corneas clear. PERRL, EOM's intact. Fundi benign. Ears: normal TM's and external ear canals both ears Nose: Nares normal. Septum midline. Mucosa normal. No drainage or sinus tenderness. Throat: lips, mucosa, and tongue normal; teeth and gums normal Neck: no adenopathy, no carotid bruit, supple, symmetrical, trachea midline and thyroid not enlarged, symmetric, no tenderness/mass/nodules Breasts: normal appearance, no masses or tenderness Heart: regular rate and rhythm, S1, S2 normal, no murmur, click, rub or gallop Abdomen: soft, non-tender; bowel sounds normal; no masses,  no organomegaly Pelvic: cervix normal in appearance, external genitalia normal, no adnexal masses or tenderness, no cervical motion tenderness, rectovaginal septum normal, uterus normal size, shape, and consistency and vagina normal without discharge Extremities: extremities normal, atraumatic, no cyanosis or edema Pulses: 2+ and symmetric Neurologic: Grossly normal    Assessment:    Healthy female exam.      Plan:     See After Visit Summary for Counseling Recommendations

## 2011-10-06 NOTE — Patient Instructions (Addendum)
It was nice to meet you! I think it's great that you are thinking about quitting smoking!!!  This is very important for your health.  There are many things we can do to help you quit.  You can also call 1-800-QUIT-NOW 231-445-7400) for free smoking cessation counseling. I am also sending in a prescription for both the patch and the gum. Both of these are covered by your insurance. Please let me know if one works better than the other, or if you would like to try something different. If you would like, you can meet with our pharmacist, Dr. Raymondo Band, to discuss other options for smoking cessation. PLEASE make sure you get a mammogram. This is very important.  We gave you your flu shot today.  I have drawn all of your blood work for [employer].  I will send you a letter with the results of all of your testing in the next few weeks. I will also let you know the results of your PAP smear in that letter.  Please come back in the next 1-2 months so we can see how your smoking cessation is going.

## 2011-10-07 LAB — GC/CHLAMYDIA PROBE AMP, GENITAL
Chlamydia, DNA Probe: NEGATIVE
GC Probe Amp, Genital: NEGATIVE

## 2011-10-07 LAB — HIV ANTIBODY (ROUTINE TESTING W REFLEX): HIV: NONREACTIVE

## 2011-10-07 LAB — SYPHILIS: RPR W/REFLEX TO RPR TITER AND TREPONEMAL ANTIBODIES, TRADITIONAL SCREENING AND DIAGNOSIS ALGORITHM

## 2011-10-10 ENCOUNTER — Encounter: Payer: Self-pay | Admitting: Family Medicine

## 2011-10-11 ENCOUNTER — Encounter: Payer: Self-pay | Admitting: Family Medicine

## 2012-01-03 ENCOUNTER — Telehealth: Payer: Self-pay | Admitting: Family Medicine

## 2012-01-03 NOTE — Telephone Encounter (Signed)
Signed and placed in to be faxed pile  

## 2012-01-03 NOTE — Telephone Encounter (Signed)
Per conversation patient will send request to pharmacy for refill on meds

## 2012-01-17 ENCOUNTER — Encounter: Payer: Self-pay | Admitting: Family Medicine

## 2012-01-17 ENCOUNTER — Ambulatory Visit (INDEPENDENT_AMBULATORY_CARE_PROVIDER_SITE_OTHER): Payer: Medicaid Other | Admitting: Family Medicine

## 2012-01-17 DIAGNOSIS — F319 Bipolar disorder, unspecified: Secondary | ICD-10-CM

## 2012-01-17 DIAGNOSIS — F17200 Nicotine dependence, unspecified, uncomplicated: Secondary | ICD-10-CM

## 2012-01-17 MED ORDER — NICOTINE 14 MG/24HR TD PT24: 1.0000 | MEDICATED_PATCH | TRANSDERMAL | Status: AC

## 2012-01-17 NOTE — Assessment & Plan Note (Signed)
Meds filled by [name], but she had missed an appointment and was going to run out of medicines and called here and we filled them until she made it to her next appt.  [facility] still filling her meds, but she was told she needed an appt in order to have [place] fill a temp supply of her psych meds.  Does not need [place] to fill them regularly.  Next [facility] appt is 2/25, for both meds and therapy.

## 2012-01-17 NOTE — Assessment & Plan Note (Signed)
Smoking 3/4ppd.  Interested in quitting.  Given Rx for Nicorderm patches (never filled the last Rx).  Given 1-800 quit line information.

## 2012-01-17 NOTE — Patient Instructions (Addendum)
It was good to see you! I'm so sorry to hear about your dad.  Please let us know if there is anything we can do. I recommend that you meet with our psychologist, Dr. Spero Geralds, if you do not want to see your therapist at [facility].  You can schedule an appointment with her by calling her directly at [phone]. Please think about quitting smoking.  This is very important for your health.  There are many things we can do to help you quit, including the patch which I sent in to the pharmacy.  Let  us know if you are interested.  You can also call 1-800-QUIT-NOW 682-042-6820) for free smoking cessation counseling.  Please come back and see me whenever you need!

## 2012-01-17 NOTE — Progress Notes (Signed)
S: Pt comes in today for follow up.  BIPOLAR D/O Patient is followed up for [facility]. However, she missed an appointment last month and was going to run out of her medications, so she called and we gave her a refill to last her until her followup appointment with them. She was told at that time that she needed an appointment to be seen here in order for her medications to be refilled, so this is her appointment. She's not having any issues other than decreased sleep and her medications are still being filled by the [facility]. There was no repercussion from having them filled here once. Her next appointment is on 2/25 with them.  Of note, her father recently died. She reports that this is from lung cancer because he was a smoker. She states that she is "not dealing with it" because it is "too hard to think about." At her next appointment at [facility], she'll be receiving both medications and therapy/counseling. She is interested in having Dr. Carola Rhine information since we are closer and easier for her to get to. She is going to give the [employer] a try, but if she does not like that person, she may decide to seek counseling here. She denies suicidal or homicidal ideation. She does endorse poor sleep since the death of her father, which [facility] is working to improve.  TOBACCO ABUSE Patient continues to smoke 3/4 pack per day. Her father's death was caused by lung cancer so she is more motivated to quit at her last visit. She never filled her NicoDerm patches at that time. She is interested in a refill on it however and feels like she is more serious about quitting smoking at this time.  ROS: Per HPI  History  Smoking status  . Current Everyday Smoker -- 0.8 packs/day  . Types: Cigarettes  Smokeless tobacco  . Not on file  Comment: would like to love to quitt per pt    O:  Filed Vitals:   01/17/12 1548  BP: 110/72  Pulse: 88    Gen: NAD, pleasant, appropriately  interactive Psych: good eye contact, mood appropriate, no SI/HI    A/P: 46 y.o. female p/w bipolar, tobacco abuse -See problem list -f/u PRN

## 2012-06-29 ENCOUNTER — Encounter (HOSPITAL_COMMUNITY): Payer: Self-pay | Admitting: *Deleted

## 2012-06-29 ENCOUNTER — Emergency Department (HOSPITAL_COMMUNITY)
Admission: EM | Admit: 2012-06-29 | Discharge: 2012-06-29 | Disposition: A | Discharge status: Home / Self Care | Payer: Medicaid Other | Attending: Emergency Medicine | Admitting: Emergency Medicine

## 2012-06-29 DIAGNOSIS — F319 Bipolar disorder, unspecified: Secondary | ICD-10-CM | POA: Insufficient documentation

## 2012-06-29 DIAGNOSIS — M545 Low back pain, unspecified: Secondary | ICD-10-CM | POA: Insufficient documentation

## 2012-06-29 DIAGNOSIS — IMO0001 Reserved for inherently not codable concepts without codable children: Secondary | ICD-10-CM | POA: Insufficient documentation

## 2012-06-29 DIAGNOSIS — F17200 Nicotine dependence, unspecified, uncomplicated: Secondary | ICD-10-CM | POA: Insufficient documentation

## 2012-06-29 DIAGNOSIS — M79606 Pain in leg, unspecified: Secondary | ICD-10-CM

## 2012-06-29 HISTORY — DX: Major depressive disorder, single episode, unspecified: F32.9

## 2012-06-29 HISTORY — DX: Bipolar disorder, unspecified: F31.9

## 2012-06-29 HISTORY — DX: Depression, unspecified: F32.A

## 2012-06-29 HISTORY — DX: Anxiety disorder, unspecified: F41.9

## 2012-06-29 MED ORDER — IBUPROFEN 800 MG PO TABS: 800.0000 mg | ORAL_TABLET | Freq: Three times a day (TID) | ORAL | Status: AC

## 2012-06-29 MED ORDER — IBUPROFEN 800 MG PO TABS
800.0000 mg | ORAL_TABLET | Freq: Once | ORAL | Status: AC
  Administered 2012-06-29: 800 mg via ORAL
  Filled 2012-06-29: qty 1

## 2012-06-29 MED ORDER — CYCLOBENZAPRINE HCL 10 MG PO TABS
10.0000 mg | ORAL_TABLET | Freq: Once | ORAL | Status: AC
  Administered 2012-06-29: 10 mg via ORAL
  Filled 2012-06-29: qty 1

## 2012-06-29 MED ORDER — CYCLOBENZAPRINE HCL 10 MG PO TABS: 10.0000 mg | ORAL_TABLET | Freq: Three times a day (TID) | ORAL | Status: DC | PRN

## 2012-06-29 NOTE — ED Provider Notes (Signed)
History     CSN: [id number]  Arrival date & time 06/29/12  2035   First MD Initiated Contact with Patient 06/29/12 2233      Chief Complaint  Patient presents with  . Leg Pain  . Spasms    (Consider location/radiation/quality/duration/timing/severity/associated sxs/prior treatment) Patient is a 46 y.o. female presenting with leg pain. The history is provided by the patient.  Leg Pain  The incident occurred 12 to 24 hours ago. There was no injury mechanism. Pertinent negatives include no numbness. Associated symptoms comments: She complains of pain in lower center back that, when it becomes intermittently painful it causes a spasm in her left thigh that moves across to her right thigh before resolving. No pain currently. No fever. No new injury to back. She reports similar symptoms in 2008 after injury at work but she has been asymptomatic for the past 2 years. No urinary symptoms, or loss of bladder or bowel control. .    Past Medical History  Diagnosis Date  . Depression   . Bipolar affective   . Anxiety     Past Surgical History  Procedure Date  . Neck surgery     History reviewed. No pertinent family history.  History  Substance Use Topics  . Smoking status: Current Everyday Smoker -- 0.8 packs/day    Types: Cigarettes  . Smokeless tobacco: Not on file   Comment: would like to love to quitt per pt  . Alcohol Use: 3.0 oz/week    5 Cans of beer per week    OB History    Grav Para Term Preterm Abortions TAB SAB Ect Mult Living                  Review of Systems  Constitutional: Negative for fever.  Gastrointestinal: Negative for abdominal pain.  Genitourinary: Negative for dysuria and difficulty urinating.  Musculoskeletal: Positive for back pain.  Neurological: Negative for numbness.    Allergies  Penicillins  Home Medications   Current Outpatient Rx  Name Route Sig Dispense Refill  . ALBUTEROL SULFATE HFA 108 (90 BASE) MCG/ACT IN AERS Inhalation  Inhale 2 puffs into the lungs 4 (four) times daily as needed. For shortness of breath    . CLONAZEPAM 0.5 MG PO TABS Oral Take 0.5 mg by mouth 2 (two) times daily as needed. For anxiety    . LITHIUM CARBONATE 300 MG PO CAPS Oral Take 300 mg by mouth 2 (two) times daily with meals.     [name] MIRTAZAPINE 30 MG PO TABS Oral Take 30 mg by mouth at bedtime.     [name] QUETIAPINE FUMARATE 300 MG PO TABS Oral Take 300 mg by mouth 2 (two) times daily.      BP 136/78  Pulse 99  Temp 99.2 F (37.3 C) (Oral)  Resp 16  SpO2 97%  Physical Exam  Constitutional: She is oriented to person, place, and time. She appears well-developed and well-nourished. No distress.  Pulmonary/Chest: Effort normal. She has no wheezes. She has no rales.  Abdominal: Soft. There is no tenderness. There is no rebound and no guarding.  Musculoskeletal:       Midline and bilateral lumbar tenderness. Lower extremities are unremarkable in appearance. No swelling, redness, or tenderness to palpation. No rigidity of muscle to indicate spasm. Distal pulses are present and equal. FROM. No loss of strength.  Neurological: She is alert and oriented to person, place, and time. Coordination normal.  Skin: Skin is warm and dry. No rash  noted. No erythema.  Psychiatric: She has a normal mood and affect.    ED Course  Procedures (including critical care time)  Labs Reviewed - No data to display No results found.   No diagnosis found.  1. Low back pain 2. Muscular pain lower extremities.  MDM  The patient reports muscle relaxers are typically helpful. She is a [facility] patient. Will give ibuprofen/flexeril and follow up with PCP.        Rodena Medin, PA-C 06/29/12 2320
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
## 2012-07-01 NOTE — ED Provider Notes (Signed)
Medical screening examination/treatment/procedure(s) were performed by non-physician practitioner and as supervising physician I was immediately available for consultation/collaboration.   Joya Gaskins, MD 07/01/12 [phone]

## 2012-07-02 ENCOUNTER — Encounter: Payer: Self-pay | Admitting: Family Medicine

## 2012-07-02 ENCOUNTER — Ambulatory Visit (INDEPENDENT_AMBULATORY_CARE_PROVIDER_SITE_OTHER): Payer: Medicaid Other | Admitting: Family Medicine

## 2012-07-02 VITALS — BP 130/88 | HR 112 | Ht 64.0 in | Wt 120.0 lb

## 2012-07-02 DIAGNOSIS — M62838 Other muscle spasm: Secondary | ICD-10-CM

## 2012-07-02 DIAGNOSIS — F319 Bipolar disorder, unspecified: Secondary | ICD-10-CM

## 2012-07-02 LAB — COMPREHENSIVE METABOLIC PANEL
ALT: <8 U/L (ref 0–35)
AST: 12 U/L (ref 0–37)
Albumin: 5 g/dL (ref 3.5–5.2)
Alkaline Phosphatase: 107 U/L (ref 39–117)
Glucose, Bld: 98 mg/dL (ref 70–99)
Potassium: 4.2 mEq/L (ref 3.5–5.3)
Sodium: 137 mEq/L (ref 135–145)
Total Protein: 7.2 g/dL (ref 6.0–8.3)

## 2012-07-02 MED ORDER — CYCLOBENZAPRINE HCL 10 MG PO TABS: 10.0000 mg | ORAL_TABLET | Freq: Three times a day (TID) | ORAL | Status: DC | PRN

## 2012-07-02 NOTE — Patient Instructions (Addendum)
It was good to see you.  I'm sorry you're having these muscle spasms.  We are checking some blood work.  I will send you a letter with the results.   I am giving you a little more Flexeril to help with the spasms.  These should start to improve on their own.  Make sure you are drinking plenty of water and make sure you are doing soft, gentle stretches.  Come back if these do not get better in the next week or so.

## 2012-07-02 NOTE — Progress Notes (Signed)
S: Pt comes in today for SDA for muscle spasms in her legs.   Has previously had problems with muscle spasms, but not in the past 2 years.  Has been having severe spasms in the back of other of her thighs and across front of pelvis.  Started on Saturday in the middle of the night, went to the ED.  While in the ED, got flexeril and antipyretic.  Flexeril has helped significantly, but can still feel the spasms, just aren't so painful.  No N/V/D, no URI-symptoms.  Has been having some constipation-- did not take anything for it.  Has been eating and drinking.  No new exercises or activity changes.    No ton any blood pressure medications.  Is on lithium.   ROS: Per HPI  History  Smoking status  . Current Everyday Smoker -- 0.8 packs/day  . Types: Cigarettes  Smokeless tobacco  . Not on file  Comment: would like to love to quitt per pt    O:  Filed Vitals:   07/02/12 1015  BP: 130/88  Pulse: 112    Gen: NAD, standing, uncomfortable CV: RRR, no murmur Pulm: CTA bilat, no wheezes or crackles Ext: Warm, no chronic skin changes, no edema; + trigger point over gluteus maximum bilaterally   A/P: 46 y.o. female p/w muscle spasms -See problem list -f/u in 2-3 weeks if not improved.

## 2012-07-02 NOTE — Assessment & Plan Note (Signed)
Likely muscle strain of unclear etiology.  Refill of flexeril given.  Encouraged po hydration and gentle stretches. Labs checked today to r/o electrolyte cause. F/u if not improving.

## 2012-07-02 NOTE — Assessment & Plan Note (Signed)
Recheck lithium level due to muscle cramps.

## 2012-07-03 ENCOUNTER — Encounter: Payer: Self-pay | Admitting: Family Medicine

## 2012-07-09 ENCOUNTER — Other Ambulatory Visit: Payer: Self-pay | Admitting: Family Medicine

## 2012-07-09 NOTE — Telephone Encounter (Signed)
Patient is calling to let Dr. Fara Boros know that she had taken the [facility] to [facility] for them to hold until she needed it but they misplaced it.  So, [facility] will be contacting [facility] to approve the refill and she just wanted it to be known that the reason is that [facility].

## 2012-07-09 NOTE — Telephone Encounter (Signed)
Fwd. To PCP 

## 2012-07-10 NOTE — Telephone Encounter (Signed)
Please let pt know that I have sent in the refill and appreciate the call from her.  Please ask if she is still having muscle spasms. Thanks!

## 2012-07-10 NOTE — Telephone Encounter (Signed)
LMOM advising pt to rt call. 

## 2012-07-26 ENCOUNTER — Ambulatory Visit: Payer: Medicaid Other | Admitting: Family Medicine

## 2012-07-28 ENCOUNTER — Other Ambulatory Visit: Payer: Self-pay | Admitting: Family Medicine

## 2012-08-27 ENCOUNTER — Other Ambulatory Visit: Payer: Self-pay | Admitting: Family Medicine

## 2012-09-11 ENCOUNTER — Encounter: Payer: Self-pay | Admitting: Family Medicine

## 2012-09-11 ENCOUNTER — Ambulatory Visit (INDEPENDENT_AMBULATORY_CARE_PROVIDER_SITE_OTHER): Payer: Medicaid Other | Admitting: Family Medicine

## 2012-09-11 VITALS — BP 91/65 | HR 112 | Temp 98.2°F | Ht 64.0 in | Wt 120.0 lb

## 2012-09-11 DIAGNOSIS — M62838 Other muscle spasm: Secondary | ICD-10-CM

## 2012-09-11 MED ORDER — CYCLOBENZAPRINE HCL 10 MG PO TABS: 10.0000 mg | ORAL_TABLET | Freq: Three times a day (TID) | ORAL | Status: DC | PRN

## 2012-09-11 NOTE — Patient Instructions (Signed)
It was nice to see you today.  I refilled your flexeril-- it should be waiting at the pharmacy.  You could try using ibuprofen first to see if it helps-- you can take 600-800mg  (3-4 of the over the counter tablets) 3 times a day.  Then you can use the flexeril on top of it if/when needed.  I put in for a physical therapy referral.    Come back and see me in a few months, especially if this isn't getting any better.
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
## 2012-09-11 NOTE — Assessment & Plan Note (Signed)
Muscle cramps, no spasms yet. Given flexeril. Encouraged ibuprofen for possible muscle strain/inflammation component.  Will try PT for stretching/strengthening.

## 2012-09-24 ENCOUNTER — Ambulatory Visit: Payer: Medicaid Other | Attending: Family Medicine

## 2012-10-02 ENCOUNTER — Encounter: Payer: Self-pay | Admitting: Family Medicine

## 2012-10-02 ENCOUNTER — Ambulatory Visit (INDEPENDENT_AMBULATORY_CARE_PROVIDER_SITE_OTHER): Payer: Medicaid Other | Admitting: Family Medicine

## 2012-10-02 VITALS — BP 133/84 | HR 125 | Temp 98.3°F | Wt 119.0 lb

## 2012-10-02 DIAGNOSIS — F17200 Nicotine dependence, unspecified, uncomplicated: Secondary | ICD-10-CM

## 2012-10-02 DIAGNOSIS — R05 Cough: Secondary | ICD-10-CM | POA: Insufficient documentation

## 2012-10-02 MED ORDER — NICOTINE 21 MG/24HR TD PT24: 1.0000 | MEDICATED_PATCH | TRANSDERMAL | Status: DC

## 2012-10-02 NOTE — Patient Instructions (Addendum)
Follow up as needed    Flu shot today

## 2012-10-02 NOTE — Assessment & Plan Note (Signed)
May be due to reactive airways (cold, predisposition due to significant smoking history), mild viral infection She felt better after albuterol neb treatment. She is unable to afford albuterol inhaler.  We talked about different options (anti-histamine, cough medications, monitoring symptoms). She declined other medications for now, and I also agree that I do not know how helpful they will be considering the mildness of her symptoms.  We will monitor for now, she may try honey, and will RTC if symptoms worsen.

## 2012-10-02 NOTE — Assessment & Plan Note (Signed)
She was strongly encouraged to quit smoking.  She would like to try nicotine patch.
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
## 2012-11-05 ENCOUNTER — Encounter: Payer: Self-pay | Admitting: Home Health Services

## 2012-12-13 ENCOUNTER — Ambulatory Visit (INDEPENDENT_AMBULATORY_CARE_PROVIDER_SITE_OTHER): Payer: Medicaid Other | Admitting: Family Medicine

## 2012-12-13 VITALS — BP 120/82 | HR 101 | Ht 64.0 in | Wt 124.0 lb

## 2012-12-13 DIAGNOSIS — F319 Bipolar disorder, unspecified: Secondary | ICD-10-CM

## 2012-12-13 MED ORDER — LITHIUM CARBONATE 300 MG PO CAPS: 300.0000 mg | ORAL_CAPSULE | Freq: Two times a day (BID) | ORAL | Status: DC

## 2012-12-13 MED ORDER — CLONAZEPAM 0.5 MG PO TABS: 0.5000 mg | ORAL_TABLET | Freq: Two times a day (BID) | ORAL | Status: DC | PRN

## 2012-12-13 MED ORDER — MIRTAZAPINE 30 MG PO TABS: 30.0000 mg | ORAL_TABLET | Freq: Every day | ORAL | Status: DC

## 2012-12-13 MED ORDER — QUETIAPINE FUMARATE 300 MG PO TABS: 300.0000 mg | ORAL_TABLET | Freq: Two times a day (BID) | ORAL | Status: DC

## 2012-12-13 NOTE — Assessment & Plan Note (Signed)
Refilled meds.  Pt aware it is important for her to be seen by psychiatrist regularly and that this should not become a habit. Able to contract for safety.

## 2012-12-13 NOTE — Patient Instructions (Addendum)
It was good to see you.  I'm sorry you're having such a hard time.  I recommend that you meet with our psychologist, Dr. Spero Geralds, for help dealing with your bipolar/mood problems.  You can schedule an appointment with her by calling her directly at [phone].  I refilled your meds for 1 month.  Make sure you are seeing your psychiatrist regularly to ensure that you don't run out.  Please let me know if you decide you want to leave your current relationship.  We can help you get in touch with resources.  Come back to see me as needed.  Call or go to the nearest ER if you start to have thoughts of wanting to hurt yourself.
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
## 2013-01-06 ENCOUNTER — Other Ambulatory Visit: Payer: Self-pay | Admitting: Family Medicine

## 2013-03-11 ENCOUNTER — Encounter: Payer: Self-pay | Admitting: Family Medicine

## 2013-03-11 ENCOUNTER — Ambulatory Visit (INDEPENDENT_AMBULATORY_CARE_PROVIDER_SITE_OTHER): Payer: Medicaid Other | Admitting: Family Medicine

## 2013-03-11 VITALS — BP 108/74 | HR 112 | Temp 98.4°F | Ht 64.75 in | Wt 117.0 lb

## 2013-03-11 DIAGNOSIS — N390 Urinary tract infection, site not specified: Secondary | ICD-10-CM

## 2013-03-11 DIAGNOSIS — M62838 Other muscle spasm: Secondary | ICD-10-CM

## 2013-03-11 DIAGNOSIS — R399 Unspecified symptoms and signs involving the genitourinary system: Secondary | ICD-10-CM | POA: Insufficient documentation

## 2013-03-11 LAB — POCT UA - MICROSCOPIC ONLY: WBC, Ur, HPF, POC: >20

## 2013-03-11 LAB — POCT URINALYSIS DIPSTICK
Protein, UA: 30
Spec Grav, UA: 1.02
pH, UA: 7

## 2013-03-11 MED ORDER — CYCLOBENZAPRINE HCL 10 MG PO TABS: 10.0000 mg | ORAL_TABLET | Freq: Three times a day (TID) | ORAL | Status: DC | PRN

## 2013-03-11 MED ORDER — CIPROFLOXACIN HCL 500 MG PO TABS: 500.0000 mg | ORAL_TABLET | Freq: Every day | ORAL | Status: DC

## 2013-03-11 NOTE — Assessment & Plan Note (Signed)
Stable on Flexeril. I refilled her med.

## 2013-03-11 NOTE — Assessment & Plan Note (Signed)
Urine dip stick positive for moderate leukocyte,trace ketone,+protein and small RBC.         Urine sent to lab for culture.         Ciprofloxacin prescribed pending culture since she is symptomatic.        Increase hydration encouraged.         I will call her with culture report.

## 2013-03-11 NOTE — Addendum Note (Signed)
Addended by: [place], Jamisen Hawes on: 03/11/2013 10:13 AM   Modules accepted: Orders

## 2013-03-11 NOTE — Progress Notes (Signed)
Subjective:     Patient ID: [name], female   DOB: 1966/10/12, 47 y.o.   MRN: [id number]  HPI Fever:Last yesterday,body Urine odor: C/O urine odor 1 wk,urine smells bad,she denies change in urine color,no blood in her urine,she stated she had not washed her under wear for 2 wks,hence has not been wearing under wear,no change in diet or meal,no vaginal discharge,she had fever in the past few days which won't go away even with tylenol until yesterday,today she denies any fever,color of urine is amber and cloudy.She had UTI in the past about 30 yrs ago.Not sexually active,no prior STD.Denies any pelvic or abdominal pain today,but had an episode 2 wks ago for 2 days. LL Muscle spasm: On Flexeril,need refill of med.  Past Medical History  Diagnosis Date  . Depression   . Bipolar affective   . Anxiety     Review of Systems  Constitutional: Negative for fever.  Respiratory: Negative.   Cardiovascular: Negative.   Gastrointestinal: Negative.   Genitourinary: Negative for dysuria, frequency, hematuria and flank pain.       Cloudy smelling urine  All other systems reviewed and are negative.    Filed Vitals:   03/11/13 0852  BP: 108/74  Pulse: 112  Temp: 98.4 F (36.9 C)  Height: 5' 4.75" (1.645 m)  Weight: 117 lb (53.071 kg)       Objective:   Physical Exam  Nursing note and vitals reviewed. Constitutional: She is oriented to person, place, and time. She appears well-developed. No distress.  Cardiovascular: Normal rate, regular rhythm, normal heart sounds and intact distal pulses.   No murmur heard. Pulmonary/Chest: Effort normal and breath sounds normal. No respiratory distress. She has no wheezes.  Abdominal: Soft. Bowel sounds are normal. She exhibits no distension and no mass. There is no tenderness.  Musculoskeletal: Normal range of motion.  Neurological: She is alert and oriented to person, place, and time.           Assessment/Plan:     UTI: Urine dip stick  positive for moderate leukocyte,trace ketone,+protein and small RBC.         Urine sent to lab for culture.         Ciprofloxacin prescribed pending culture since she is symptomatic.        Increase hydration encouraged.         I will call her with culture report.  Muscle spasm: Stable on Flexeril.                            I refilled her med.

## 2013-03-11 NOTE — Patient Instructions (Signed)
It was nice to have you here today,your urine test does showed som abnormal result hence I will send urine to lab for culture,I will start you on Ciprofloxacin for 3 days pending culture report since you are having symptom.Return soon if symptom persist.

## 2013-03-13 ENCOUNTER — Telehealth: Payer: Self-pay | Admitting: Family Medicine

## 2013-03-13 NOTE — Telephone Encounter (Signed)
Urine culture report discussed with patient,she is on Cipro,she stated symptom resolved after she started Ciprofloxacin,f/u prn.

## 2013-05-27 ENCOUNTER — Telehealth: Payer: Self-pay | Admitting: Family Medicine

## 2013-05-27 NOTE — Telephone Encounter (Signed)
Spoke with patient and she requested lithium results to be faxed.  Last result from07/2013 faxed to [facility].  [name], [name], CMA

## 2013-05-27 NOTE — Telephone Encounter (Signed)
Patient is requesting that her latest lab results be faxed to [facility] at [phone]. JW

## 2013-06-10 ENCOUNTER — Encounter: Payer: Self-pay | Admitting: Family Medicine

## 2013-06-10 ENCOUNTER — Ambulatory Visit (INDEPENDENT_AMBULATORY_CARE_PROVIDER_SITE_OTHER): Payer: Medicaid Other | Admitting: Family Medicine

## 2013-06-10 VITALS — BP 130/80 | HR 108 | Temp 98.3°F | Wt 177.4 lb

## 2013-06-10 DIAGNOSIS — F319 Bipolar disorder, unspecified: Secondary | ICD-10-CM

## 2013-06-10 DIAGNOSIS — N926 Irregular menstruation, unspecified: Secondary | ICD-10-CM

## 2013-06-10 DIAGNOSIS — N951 Menopausal and female climacteric states: Secondary | ICD-10-CM | POA: Insufficient documentation

## 2013-06-10 DIAGNOSIS — Z7189 Other specified counseling: Secondary | ICD-10-CM

## 2013-06-10 DIAGNOSIS — M62838 Other muscle spasm: Secondary | ICD-10-CM

## 2013-06-10 DIAGNOSIS — E785 Hyperlipidemia, unspecified: Secondary | ICD-10-CM

## 2013-06-10 DIAGNOSIS — Z716 Tobacco abuse counseling: Secondary | ICD-10-CM

## 2013-06-10 DIAGNOSIS — F17200 Nicotine dependence, unspecified, uncomplicated: Secondary | ICD-10-CM

## 2013-06-10 MED ORDER — GABAPENTIN 300 MG PO CAPS: 300.0000 mg | ORAL_CAPSULE | Freq: Three times a day (TID) | ORAL | Status: DC

## 2013-06-10 NOTE — Patient Instructions (Signed)
Thank you for coming in, today! For your hot flashes:  We will start a medicine called gabapentin. This is usually for nerve pain and might help with your leg cramps.  For the first 3-4 days, take 300 mg at bedtime.  After that, you can take 300 mg twice per day for another 3-4 days.  After that, if it does not make you very sleepy, you can try taking it up to 3 times per day, as the label states.  We will check a TSH today. For you leg cramps:   You can take Flexeril up to 3 times per day.  The gabapentin may also help. We will also check a lithium level today. Make a lab appointment to come back for a FASTING lipid panel.  Depending on what your lipids look like, Dr. Ermalinda Memos might start you on a cholesterol medication.  Stopping smoking will likely help lots of your symptoms, including helping with your cholesterol. Make an appointment to come see Dr. Ermalinda Memos in about 4 weeks. Come back sooner if you need. Please feel free to call with any questions or concerns at any time, at [phone]. --Dr. Casper Harrison

## 2013-06-11 LAB — LITHIUM LEVEL: Lithium Lvl: 0.4 mEq/L — ABNORMAL LOW (ref 0.80–1.40)

## 2013-06-13 ENCOUNTER — Other Ambulatory Visit: Payer: Self-pay | Admitting: Family Medicine

## 2013-06-16 ENCOUNTER — Telehealth: Payer: Self-pay | Admitting: Family Medicine

## 2013-06-16 DIAGNOSIS — F319 Bipolar disorder, unspecified: Secondary | ICD-10-CM

## 2013-06-16 NOTE — Assessment & Plan Note (Signed)
Persistent for about 1 to 1.5 years, now with other symptoms suggestive of perimenopause. F/u as needed.

## 2013-06-16 NOTE — Telephone Encounter (Signed)
Pt's lithium level on 7/8 0.40 (low). Pt requested result to be faxed to [facility]. Please fax result and call pt to inform her that result will be faxed and that she should follow up with [facility]. Thanks! --CMS

## 2013-06-16 NOTE — Progress Notes (Signed)
  Subjective:    Patient ID: [name], female    DOB: 06-Oct-1966, 47 y.o.   MRN: [id number]  HPI: Pt presents for general f/u and to discuss possible perimenopausal symptoms.  Bipolar disorder: on lithium, followed at [facility]; requests level check today with result faxed to [facility]  -reports compliance with Dierdre Searles, Seroquel; rarely uses Klonopin  -no new/different manic or depressive-type symptoms, generally feels well  Leg cramps: long-standing problem, unpredictable, worse with more sitting during the day; some improvement when getting up and walking once per hour or so  -taking Flexeril but only one dose in the mornings with decent relief; has not taken Flexeril more than once during the day  -cramps tend to start in one leg, very painful, and then tend to develop in the other  -pain is somewhat burning in nature; no known history of frank neuropathy, has never taken gabapentin  HLD: Pt initially scheduled this appointment for fasting lipid panel but has eaten breakfast this morning.  -pt uncertain if she has previously been on statin in the past, but has been off for at least a few years if she ever has taken one  -last lipid panel 2 years ago  Perimenopause: pt reports severe, unpredictable hot flashes for the last 3 months, lasting 2-3 minutes at a time, 6-7 time per day (pt experienced one during this interview)  -pt has occasional mood swings with bipolar disorder as above, so is unsure if this is new/different along with hot flashes  -pt's last period was 4 months ago, but reports irregularity for the past year  -pt is unsure of any thyroid disease and does report that she has generally poor exercise habits  Pt is a current smoker. Interested in quitting but declines counseling/etc at this point. Cutting back on her own. In addition to the above documentation, pt's PMH, surgical history, FH, and SH all reviewed and updated where appropriate in the EMR. I have also reviewed and  updated the pt's allergies and current medications as appropriate.  Review of Systems: As above. Otherwise, full 12-system ROS was reviewed and all negative.     Objective:   Physical Exam BP 130/80  Pulse 108  Temp(Src) 98.3 F (36.8 C) (Oral)  Wt 177 lb 6.4 oz (80.468 kg)  BMI 29.74 kg/m2 Gen: well-appearing adult female in NAD HEENT: [place]/AT, sclerae/conjunctivae clear, no lid lag, EOMI, PERRLA   MMM, posterior oropharynx clear, no cervical lymphadenopathy  neck supple with full ROM, no masses appreciated; thyroid not enlarged  Cardio: RRR, no murmur appreciated; distal pulses intact/symmetric Pulm: CTAB, no wheezes, normal WOB  Abd: soft, nondistended, BS+, no HSM Ext: warm/well-perfused, no cyanosis/clubbing/edema MSK: strength 5/5 in all four extremities, no frank joint deformity/effusion;   normal ROM to all four extremities with no point muscle/bony tenderness in spine Neuro/Psych: alert/oriented, sensation grossly intact; normal gait/balance  mood euthymic with congruent affect     Assessment & Plan:

## 2013-06-16 NOTE — Assessment & Plan Note (Signed)
A: Long-standing, some relief with Flexeril but pt not taking it more than once per day, per her report.  P: Instructed to take Flexeril up to 3 times per day, as well as to continue stretching exercises. Rx for gabapentin, as well (see also perimenopausal problem list note). F/u PRN.

## 2013-06-16 NOTE — Telephone Encounter (Signed)
1.) faxed labs to [facility] # [phone]. 2.) called pt, unable to contact pt. Mail box is full. If pt calls back, please tell her that we faxed the lithium level results to [facility] and to f/up there. Thank you. Lorenda Hatchet, Renato Battles

## 2013-06-16 NOTE — Assessment & Plan Note (Signed)
A: Hot flashes, ?mood swings, and irregular menses for 1 year, no menses at all for 4 months. Unsure when her mother went through menopause. Uncertain of any thyroid disease.  P: Rx for gabapentin for leg cramps/pain as well as vasomotor symptoms. TSH wnl. Follow up as needed with PCP.

## 2013-06-16 NOTE — Assessment & Plan Note (Signed)
A: Not currently on statin, unsure if she has been on one in the past. Lipid panel not drawn today due to pt not fasting.  P: Pt instructed to return for fasting lab visit, then to f/u with PCP.

## 2013-06-16 NOTE — Assessment & Plan Note (Signed)
A: Stable symptoms on lithium and Seroquel, followed at [facility]. Requests lithium level check and result faxed to [facility].  P: Continue current meds, follow up with [facility] as scheduled/needed.

## 2013-06-16 NOTE — Assessment & Plan Note (Signed)
Pt interested in quitting, but cutting back on her own. Counseled on complete cessation today. Pt declines referral to health coach, etc. F/u as needed.

## 2013-06-17 ENCOUNTER — Other Ambulatory Visit: Payer: Medicaid Other

## 2013-06-17 DIAGNOSIS — E785 Hyperlipidemia, unspecified: Secondary | ICD-10-CM

## 2013-06-17 NOTE — Progress Notes (Signed)
FLP DONE TODAY [name] 

## 2013-06-18 LAB — LIPID PANEL
Cholesterol: 235 mg/dL — ABNORMAL HIGH (ref 0–200)
Total CHOL/HDL Ratio: 5.2 Ratio

## 2013-06-19 ENCOUNTER — Encounter: Payer: Self-pay | Admitting: Family Medicine

## 2013-06-19 ENCOUNTER — Telehealth: Payer: Self-pay | Admitting: Family Medicine

## 2013-06-19 NOTE — Telephone Encounter (Signed)
Called patient to explain the results from her recent lipid panel indicated that she should start a new medicine. She did not answer and her mailbox is full and I cannot leave a message. I will plan to followup with her in the clinic as Dr. Casper Harrison indicated to her previously and start Lipitor 20 mg daily at that time. I will send her a letter with her results and  Let her know she needs to start a new medication and call me at if she would like to start it before her followup visit.   Murtis Sink, MD [facility] Family Medicine Resident, PGY-2 06/19/2013, 10:42 AM

## 2013-06-19 NOTE — Telephone Encounter (Signed)
Message copied by Elenora Gamma on Thu Jun 19, 2013 10:37 AM ------      Message from: Bobbye Morton      Created: Wed Jun 18, 2013  7:13 AM       Here's the lipid panel I mentioned in my other message. Thanks!      --Thayer Ohm            ----- Message -----         From: [facility]         Sent: 06/18/2013  12:45 AM           To: Stephanie Coup Street, MD                   ------

## 2013-07-03 ENCOUNTER — Other Ambulatory Visit: Payer: Self-pay | Admitting: Family Medicine

## 2013-08-13 ENCOUNTER — Ambulatory Visit (INDEPENDENT_AMBULATORY_CARE_PROVIDER_SITE_OTHER): Payer: Self-pay | Admitting: Family Medicine

## 2013-08-13 ENCOUNTER — Encounter: Payer: Self-pay | Admitting: Family Medicine

## 2013-08-13 VITALS — BP 126/85 | HR 106 | Temp 98.2°F | Wt 117.0 lb

## 2013-08-13 DIAGNOSIS — M542 Cervicalgia: Secondary | ICD-10-CM

## 2013-08-13 DIAGNOSIS — R300 Dysuria: Secondary | ICD-10-CM

## 2013-08-13 DIAGNOSIS — N760 Acute vaginitis: Secondary | ICD-10-CM

## 2013-08-13 DIAGNOSIS — F319 Bipolar disorder, unspecified: Secondary | ICD-10-CM

## 2013-08-13 DIAGNOSIS — Z711 Person with feared health complaint in whom no diagnosis is made: Secondary | ICD-10-CM | POA: Insufficient documentation

## 2013-08-13 DIAGNOSIS — N390 Urinary tract infection, site not specified: Secondary | ICD-10-CM

## 2013-08-13 LAB — POCT URINALYSIS DIPSTICK
Glucose, UA: NEGATIVE
Ketones, UA: NEGATIVE
Protein, UA: NEGATIVE
Spec Grav, UA: 1.02
Urobilinogen, UA: 0.2

## 2013-08-13 LAB — POCT UA - MICROSCOPIC ONLY

## 2013-08-13 MED ORDER — QUETIAPINE FUMARATE 300 MG PO TABS: 300.0000 mg | ORAL_TABLET | Freq: Two times a day (BID) | ORAL | Status: DC

## 2013-08-13 MED ORDER — MIRTAZAPINE 30 MG PO TABS: 30.0000 mg | ORAL_TABLET | Freq: Every day | ORAL | Status: DC

## 2013-08-13 MED ORDER — SULFAMETHOXAZOLE-TMP DS 800-160 MG PO TABS: 1.0000 | ORAL_TABLET | Freq: Two times a day (BID) | ORAL | Status: DC

## 2013-08-13 NOTE — Patient Instructions (Signed)
It was great to meet you!  The tea I was talking about is Black Cohosh  I need you to schedule an x ray for your neck    Cervical Strain Care After A cervical strain is when the muscles and ligaments in your neck have been stretched. The bones are not broken. If you had any problems moving your arms or legs immediately after the injury, even if the problem has gone away, make sure to tell this to your caregiver.  HOME CARE INSTRUCTIONS   While awake, apply ice packs to the neck or areas of pain about every 1 to 2 hours, for 15 to 20 minutes at a time. Do this for 2 days. If you were given a cervical collar for support, ask your caregiver if you may remove it for bathing or applying ice.   If given a cervical collar, wear as instructed. Do not remove any collar unless instructed by a caregiver.   Only take over-the-counter or prescription medicines for pain, discomfort, or fever as directed by your caregiver.  Recheck with the hospital or clinic after a radiologist has read your X-rays. Recheck with the hospital or clinic to make sure the initial readings are correct. Do this also to determine if you need further studies. It is your responsibility to find out your X-ray results. X-rays are sometimes repeated in one week to ten days. These are often repeated to make sure that a hairline fracture was not overlooked. Ask your caregiver how you are to find out about your radiology (X-ray) results. SEEK IMMEDIATE MEDICAL CARE IF:   You have increasing pain in your neck.   You develop difficulties swallowing or breathing.   You have numbness, weakness, or movement problems in the arms or legs.   You have difficulty walking.   You develop bowel or bladder retention or incontinence.   You have problems with walking.  MAKE SURE YOU:   Understand these instructions.   Will watch your condition.   Will get help right away if you are not doing well or get worse.  Document Released:  11/20/2005 Document Revised: 08/02/2011 Document Reviewed: 07/03/2008 [facility] Patient Information 2012 [place], [place].
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
## 2013-08-13 NOTE — Assessment & Plan Note (Signed)
With positive UA, will culture Treat with bactrim DS for 5 days as previous was with Cipro and she is penn allergic
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
## 2013-08-13 NOTE — Assessment & Plan Note (Signed)
Refilled 10 days of Seroquel and Remeron and asked her to call [facility] tomorrow to schedule an appt for sooner and explain that she doesn't have her meds.  Did not refill Lithium per discussion with preceptor.

## 2013-08-14 LAB — RPR

## 2013-08-14 LAB — HEPATITIS C ANTIBODY: HCV Ab: NEGATIVE

## 2013-08-14 LAB — HEPATITIS B SURFACE ANTIBODY, QUANTITATIVE: Hepatitis B-Post: 21.9 m[IU]/mL

## 2013-08-18 ENCOUNTER — Telehealth: Payer: Self-pay | Admitting: Clinical
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
## 2013-08-28 ENCOUNTER — Telehealth: Payer: Self-pay | Admitting: Family Medicine

## 2013-08-28 NOTE — Telephone Encounter (Signed)
Instructed patient to please obtain phone number for this place.  I was unable to locate.  Pt verbalized understanding and will call back.  [name], [name], CMA

## 2013-08-28 NOTE — Telephone Encounter (Signed)
Pt called and would like a refill on gabapentin sent to a new pharmacy. She wants to use [facility] on [address]. JW

## 2013-09-03 ENCOUNTER — Other Ambulatory Visit: Payer: Self-pay | Admitting: Family Medicine

## 2013-09-11 ENCOUNTER — Ambulatory Visit: Payer: Medicaid Other | Admitting: Family Medicine

## 2013-09-17 ENCOUNTER — Encounter: Payer: Self-pay | Admitting: Family Medicine

## 2013-09-17 ENCOUNTER — Ambulatory Visit (INDEPENDENT_AMBULATORY_CARE_PROVIDER_SITE_OTHER): Payer: Self-pay | Admitting: Family Medicine

## 2013-09-17 VITALS — BP 120/75 | HR 125 | Ht 64.0 in | Wt 116.0 lb

## 2013-09-17 DIAGNOSIS — N390 Urinary tract infection, site not specified: Secondary | ICD-10-CM

## 2013-09-17 DIAGNOSIS — T7411XD Adult physical abuse, confirmed, subsequent encounter: Secondary | ICD-10-CM

## 2013-09-17 DIAGNOSIS — N951 Menopausal and female climacteric states: Secondary | ICD-10-CM

## 2013-09-17 DIAGNOSIS — M62838 Other muscle spasm: Secondary | ICD-10-CM

## 2013-09-17 DIAGNOSIS — T7411XA Adult physical abuse, confirmed, initial encounter: Secondary | ICD-10-CM | POA: Insufficient documentation

## 2013-09-17 DIAGNOSIS — Z5189 Encounter for other specified aftercare: Secondary | ICD-10-CM

## 2013-09-17 LAB — POCT URINALYSIS DIPSTICK
Bilirubin, UA: NEGATIVE
Ketones, UA: NEGATIVE
Protein, UA: NEGATIVE
Spec Grav, UA: 1.02
Urobilinogen, UA: 0.2
pH, UA: 7.5

## 2013-09-17 LAB — POCT UA - MICROSCOPIC ONLY

## 2013-09-17 MED ORDER — CIPROFLOXACIN HCL 250 MG PO TABS: 250.0000 mg | ORAL_TABLET | Freq: Two times a day (BID) | ORAL | Status: DC

## 2013-09-17 MED ORDER — CYCLOBENZAPRINE HCL 10 MG PO TABS: 10.0000 mg | ORAL_TABLET | Freq: Two times a day (BID) | ORAL | Status: DC | PRN

## 2013-09-17 NOTE — Assessment & Plan Note (Signed)
Helped by flexeril, will continue.

## 2013-09-17 NOTE — Assessment & Plan Note (Signed)
With + nitrites,Treat with 3 ds of Cipro Sent for culture Previous UTI was treated effectively with bactrim, E coli

## 2013-09-17 NOTE — Assessment & Plan Note (Signed)
Would not try HRT in this patient given current smoking She agrees when I discuss the risk of heart attack, stroke, and DVT Encouraged use of black cohosh

## 2013-09-17 NOTE — Patient Instructions (Signed)
It was great to see you!!  I will talk to norma and see if we can get you somewhere to go.   Black Cohosh is the herb helpful for hot flashes
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
## 2013-09-17 NOTE — Addendum Note (Signed)
Addended by: Elenora Gamma on: 09/17/2013 05:18 PM   Modules accepted: Orders

## 2013-09-18 ENCOUNTER — Telehealth: Payer: Self-pay | Admitting: Clinical

## 2013-09-18 NOTE — Telephone Encounter (Signed)
Clinical Child psychotherapist (CSW) attempted to call pt again however pt mailbox is full. CSW unable to leave contact information for a returned call.  Theresia Bough, MSW, LCSW [phone]

## 2013-09-19 LAB — URINE CULTURE: Colony Count: >=100000

## 2013-10-06 ENCOUNTER — Telehealth: Payer: Self-pay | Admitting: Family Medicine

## 2013-10-15 ENCOUNTER — Ambulatory Visit (HOSPITAL_COMMUNITY)
Admission: RE | Admit: 2013-10-15 | Discharge: 2013-10-15 | Disposition: A | Discharge status: Home / Self Care | Payer: Self-pay | Source: Ambulatory Visit | Attending: Family Medicine | Admitting: Family Medicine

## 2013-10-15 DIAGNOSIS — M5030 Other cervical disc degeneration, unspecified cervical region: Secondary | ICD-10-CM | POA: Insufficient documentation

## 2013-10-15 DIAGNOSIS — M542 Cervicalgia: Secondary | ICD-10-CM

## 2013-10-21 ENCOUNTER — Telehealth: Payer: Self-pay | Admitting: Family Medicine

## 2013-10-21 NOTE — Telephone Encounter (Signed)
Patient requesting refill for flexeril, however she was given #60 with 3 refills last month. I attempted call but only get a busy signal.   I wonder if she doesn't realize she has refills at her pharmacy, so I will defer refilling it for now. If she is using this much flexeril and has filled all of her refills then we need an appt to discuss needing more as this would be twice the daily recommended dose.   Also will ask them to ask her to call Theresia Bough, our social worker as I think the patient would benefit from a discussion with her but neither of [place] seem to be able to get her on the phone.   Will ask staff to re-attempt call tomorrow  Murtis Sink, MD [facility] Family Medicine Resident, PGY-2 10/21/2013, 5:34 PM

## 2013-10-21 NOTE — Telephone Encounter (Signed)
Pt called and needs a refill on her flexeril sent to the pharmacy [facility]. jw

## 2013-10-21 NOTE — Telephone Encounter (Signed)
Will fwd to MD,,, Radene Ou, CMA

## 2013-10-22 NOTE — Telephone Encounter (Signed)
Attempted to call patient, was able to reach a voicemail, but mailbox is full so unable to leave a message.  Raymund Manrique, [name], CMA

## 2013-10-22 NOTE — Telephone Encounter (Signed)
Attempted to call again.  No answer.  Hilmer Aliberti L, CMA  

## 2013-10-23 ENCOUNTER — Telehealth: Payer: Self-pay | Admitting: Family Medicine

## 2013-10-23 NOTE — Telephone Encounter (Signed)
Forward message to PCP

## 2013-10-23 NOTE — Telephone Encounter (Signed)
Will FWD to MD regarding Xrays.  I will call patient back with results.   Emilie Rutter, [name], CMA

## 2013-10-23 NOTE — Telephone Encounter (Signed)
Pt called and would like someone to call her about her recent xray's of her neck. jw

## 2013-10-24 NOTE — Telephone Encounter (Signed)
Will fwd to Dr. Bradshaw.   Mackayla Mullins L, CMA  

## 2013-10-25 NOTE — Telephone Encounter (Signed)
Tried to call patient, there is only a busy signal without VM.   Review of the films show no acute findings and progression of her known degenerative disk disease at C5-C6. She has an anterior slip of the cervical spine which is stable since last film.   I am willing to refer her to her neurosurgeon if she'd like to.  Since it is so hard to get up with her on the phone she probably needs an appointment to discuss all of this.   Murtis Sink, MD [facility] Family Medicine Resident, PGY-2 10/25/2013, 2:54 PM

## 2013-12-16 ENCOUNTER — Telehealth: Payer: Self-pay | Admitting: Family Medicine

## 2013-12-16 NOTE — Telephone Encounter (Signed)
Pt told to call [facility] for the refills.  [name], Loralyn Freshwater, [place]

## 2013-12-16 NOTE — Telephone Encounter (Signed)
Will fwd request to PCP.  Demonie Kassa, Loralyn Freshwater, [place]

## 2013-12-16 NOTE — Telephone Encounter (Signed)
Patient requesting refills that are normally filled by [facility]. Seroquel, lithium and remeron.   As she has an established psychiatrist I will defer filling theses, particularly lithium.   Will ask team to inform her that she should contact [facility].   Laroy Apple, MD [facility] Resident, PGY-2 12/16/2013, 10:00 AM

## 2013-12-16 NOTE — Telephone Encounter (Signed)
Refill request for Seroquel, Lithium, and Remeron. Please send ALL meds to [facility] on [facility] [phone].

## 2013-12-17 ENCOUNTER — Encounter: Payer: Self-pay | Admitting: Family Medicine

## 2013-12-17 ENCOUNTER — Ambulatory Visit (INDEPENDENT_AMBULATORY_CARE_PROVIDER_SITE_OTHER): Payer: Self-pay | Admitting: Family Medicine

## 2013-12-17 ENCOUNTER — Telehealth: Payer: Self-pay | Admitting: Family Medicine

## 2013-12-17 VITALS — BP 111/78 | HR 111 | Temp 98.2°F | Resp 16 | Wt 118.0 lb

## 2013-12-17 DIAGNOSIS — R000 Tachycardia, unspecified: Secondary | ICD-10-CM

## 2013-12-17 DIAGNOSIS — F319 Bipolar disorder, unspecified: Secondary | ICD-10-CM

## 2013-12-17 DIAGNOSIS — Z23 Encounter for immunization: Secondary | ICD-10-CM

## 2013-12-17 LAB — COMPREHENSIVE METABOLIC PANEL
ALT: 8 U/L (ref 0–35)
AST: 10 U/L (ref 0–37)
Albumin: 4.4 g/dL (ref 3.5–5.2)
Alkaline Phosphatase: 97 U/L (ref 39–117)
BUN: 4 mg/dL — AB (ref 6–23)
CO2: 28 mEq/L (ref 19–32)
CREATININE: 0.67 mg/dL (ref 0.50–1.10)
Calcium: 10 mg/dL (ref 8.4–10.5)
Chloride: 103 mEq/L (ref 96–112)
Glucose, Bld: 91 mg/dL (ref 70–99)
Potassium: 4.1 mEq/L (ref 3.5–5.3)
Sodium: 137 mEq/L (ref 135–145)
Total Bilirubin: 0.2 mg/dL — ABNORMAL LOW (ref 0.3–1.2)
Total Protein: 6.7 g/dL (ref 6.0–8.3)

## 2013-12-17 MED ORDER — LITHIUM CARBONATE 300 MG PO CAPS: 300.0000 mg | ORAL_CAPSULE | Freq: Two times a day (BID) | ORAL | Status: DC

## 2013-12-17 MED ORDER — QUETIAPINE FUMARATE 300 MG PO TABS: 300.0000 mg | ORAL_TABLET | Freq: Two times a day (BID) | ORAL | Status: DC

## 2013-12-17 MED ORDER — MIRTAZAPINE 30 MG PO TABS: 30.0000 mg | ORAL_TABLET | Freq: Every day | ORAL | Status: DC

## 2013-12-17 NOTE — Assessment & Plan Note (Signed)
Explained very clearly that we should not be refilling these prescriptions, and that we will not be doing it again. Refilled one-month supply of lithium, Remeron, and Seroquel. Check renal function and electrolytes with BMP today No SI/HI Followup at [facility] with psychiatry.

## 2013-12-17 NOTE — Telephone Encounter (Signed)
Pt seen by Dr. Wendi Snipes today.  Oneil Behney, Loralyn Freshwater, [place]

## 2013-12-17 NOTE — Telephone Encounter (Signed)
Pt needs meds sent to [facility] located in the [facility]. [phone] Pt is there now waiting for the prescriptions

## 2013-12-17 NOTE — Patient Instructions (Signed)
It was great to see you toady!  Like I said, We should not be filling these here again, Schedule your appointments at [facility] close enough to get your refills.
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
## 2013-12-17 NOTE — Assessment & Plan Note (Signed)
After reviewing her encounters she's ranged 106-125 heart rate last several encounters. Etiologies are broad including exercise-induced that she is explaining, mood, and primary cv origin Given that she's off of her normal medications we'll continue to watch carefully for now We will perform EKG next office visit if still elevated.

## 2013-12-18 MED ORDER — MIRTAZAPINE 30 MG PO TABS: 30.0000 mg | ORAL_TABLET | Freq: Every day | ORAL | Status: DC

## 2013-12-18 MED ORDER — LITHIUM CARBONATE 300 MG PO CAPS: 300.0000 mg | ORAL_CAPSULE | Freq: Two times a day (BID) | ORAL | Status: DC

## 2013-12-18 MED ORDER — QUETIAPINE FUMARATE 300 MG PO TABS: 300.0000 mg | ORAL_TABLET | Freq: Two times a day (BID) | ORAL | Status: AC

## 2013-12-18 NOTE — Addendum Note (Signed)
Addended by: Timmothy Euler on: 12/18/2013 09:29 AM   Modules accepted: Orders

## 2013-12-18 NOTE — Telephone Encounter (Signed)
Sent meds to [facility] center, Will ask team to notify.   Laroy Apple, MD [place] Resident, PGY-2 12/18/2013, 9:24 AM

## 2013-12-18 NOTE — Telephone Encounter (Signed)
Called pt. Unable to leave message. 'mail box is full'. Javier Glazier, Gerrit Heck

## 2013-12-19 ENCOUNTER — Encounter: Payer: Self-pay | Admitting: Family Medicine

## 2014-09-02 ENCOUNTER — Ambulatory Visit: Payer: Self-pay | Admitting: Family Medicine

## 2014-09-16 ENCOUNTER — Encounter (HOSPITAL_COMMUNITY): Payer: Self-pay | Admitting: Emergency Medicine

## 2014-09-16 ENCOUNTER — Ambulatory Visit (HOSPITAL_COMMUNITY)
Admit: 2014-09-16 | Discharge: 2014-09-16 | Disposition: A | Discharge status: Home / Self Care | Payer: Self-pay | Source: Ambulatory Visit | Attending: Emergency Medicine | Admitting: Emergency Medicine

## 2014-09-16 ENCOUNTER — Emergency Department (HOSPITAL_COMMUNITY)
Admission: EM | Admit: 2014-09-16 | Discharge: 2014-09-16 | Disposition: A | Discharge status: Home / Self Care | Payer: Self-pay | Source: Home / Self Care | Attending: Emergency Medicine | Admitting: Emergency Medicine

## 2014-09-16 DIAGNOSIS — J209 Acute bronchitis, unspecified: Secondary | ICD-10-CM

## 2014-09-16 DIAGNOSIS — R05 Cough: Secondary | ICD-10-CM | POA: Insufficient documentation

## 2014-09-16 LAB — POCT RAPID STREP A: Streptococcus, Group A Screen (Direct): NEGATIVE

## 2014-09-16 MED ORDER — PREDNISONE 20 MG PO TABS: 20.0000 mg | ORAL_TABLET | Freq: Two times a day (BID) | ORAL | Status: DC

## 2014-09-16 MED ORDER — ALBUTEROL SULFATE HFA 108 (90 BASE) MCG/ACT IN AERS: 2.0000 | INHALATION_SPRAY | Freq: Four times a day (QID) | RESPIRATORY_TRACT | Status: DC

## 2014-09-16 MED ORDER — GUAIFENESIN-CODEINE 100-10 MG/5ML PO SYRP: 5.0000 mL | ORAL_SOLUTION | Freq: Three times a day (TID) | ORAL | Status: DC | PRN

## 2014-09-16 MED ORDER — ONDANSETRON 8 MG PO TBDP: 8.0000 mg | ORAL_TABLET | Freq: Three times a day (TID) | ORAL | Status: DC | PRN

## 2014-09-16 MED ORDER — DOXYCYCLINE HYCLATE 100 MG PO TABS: 100.0000 mg | ORAL_TABLET | Freq: Two times a day (BID) | ORAL | Status: DC

## 2014-09-16 NOTE — Discharge Instructions (Signed)
Acute Bronchitis Bronchitis is inflammation of the airways that extend from the windpipe into the lungs (bronchi). The inflammation often causes mucus to develop. This leads to a cough, which is the most common symptom of bronchitis.  In acute bronchitis, the condition usually develops suddenly and goes away over time, usually in a couple weeks. Smoking, allergies, and asthma can make bronchitis worse. Repeated episodes of bronchitis may cause further lung problems.  CAUSES Acute bronchitis is most often caused by the same virus that causes a cold. The virus can spread from person to person (contagious) through coughing, sneezing, and touching contaminated objects. SIGNS AND SYMPTOMS   Cough.   Fever.   Coughing up mucus.   Body aches.   Chest congestion.   Chills.   Shortness of breath.   Sore throat.  DIAGNOSIS  Acute bronchitis is usually diagnosed through a physical exam. Your health care provider will also ask you questions about your medical history. Tests, such as chest X-rays, are sometimes done to rule out other conditions.  TREATMENT  Acute bronchitis usually goes away in a couple weeks. Oftentimes, no medical treatment is necessary. Medicines are sometimes given for relief of fever or cough. Antibiotic medicines are usually not needed but may be prescribed in certain situations. In some cases, an inhaler may be recommended to help reduce shortness of breath and control the cough. A cool mist vaporizer may also be used to help thin bronchial secretions and make it easier to clear the chest.  HOME CARE INSTRUCTIONS  Get plenty of rest.   Drink enough fluids to keep your urine clear or pale yellow (unless you have a medical condition that requires fluid restriction). Increasing fluids may help thin your respiratory secretions (sputum) and reduce chest congestion, and it will prevent dehydration.   Take medicines only as directed by your health care provider.  If  you were prescribed an antibiotic medicine, finish it all even if you start to feel better.  Avoid smoking and secondhand smoke. Exposure to cigarette smoke or irritating chemicals will make bronchitis worse. If you are a smoker, consider using nicotine gum or skin patches to help control withdrawal symptoms. Quitting smoking will help your lungs heal faster.   Reduce the chances of another bout of acute bronchitis by washing your hands frequently, avoiding people with cold symptoms, and trying not to touch your hands to your mouth, nose, or eyes.   Keep all follow-up visits as directed by your health care provider.  SEEK MEDICAL CARE IF: Your symptoms do not improve after 1 week of treatment.  SEEK IMMEDIATE MEDICAL CARE IF:  You develop an increased fever or chills.   You have chest pain.   You have severe shortness of breath.  You have bloody sputum.   You develop dehydration.  You faint or repeatedly feel like you are going to pass out.  You develop repeated vomiting.  You develop a severe headache. MAKE SURE YOU:   Understand these instructions.  Will watch your condition.  Will get help right away if you are not doing well or get worse. Document Released: 12/28/2004 Document Revised: 04/06/2014 Document Reviewed: 05/13/2013 [facility] Patient Information 2015 [place], [place]. This information is not intended to replace advice given to you by your health care provider. Make sure you discuss any questions you have with your health care provider.   How to Quit Smoking  According to the U.S. Surgeon General, about 440,000 people in the [place] alone die from complications  related to tobacco use.  More deaths occur due to cigarette smoking than illegal drug use, AIDS, car accidents, alcohol-related deaths, suicide and homicide combined.  Smoking accounts for about 30% of all cancer related deaths, including more than 80% of lung cancer deaths. Smoking has also been  linked as the cause of many other diseases like heart disease, bronchitis, emphysema, stroke, and complications of pneumonia as well as causing an increased risk of miscarriage, premature births, stillbirth, infant death, and low birth weight in infants.  For this reason, the U.S. Surgeon General recommends:  "Smoking cessation (stopping smoking) represents the single most important step that smokers can take to enhance the length and quality of their lives."  Why is it so hard to Quit?  Tobacco products contain Nicotine which is highly addictive - probably as addictive as heroin or cocaine.  Over time, your body becomes both physically and psychologically dependent on it. Finally, attempts to quit smoking are complicated by withdrawal reactions like depression, irritability, trouble sleeping, trouble concentrating, restlessness, headaches, weight gain, and excessive fatigue as well as a lack of support.  These symptoms can last from a few days to several weeks.  Why should you Quit?  Live longer and healthier  Can improves the health of your housemates (children, spouse)  Increases your energy and breathing ability  Lowers risk of heart attack, stroke and cancer  Saves money - For example, if you smoke a pack of cigarettes a day and each pack costs about $3.00, then you will save about $1,100.00 per year, about $5,500. in 5 years and $11,000.00 in 10 years.  What you can do: 1. Talk to your health care provider - There are many smoking cessations aids available, both prescription or over-the-counter.  Check with your doctor and pharmacist before taking any of these products to see which one is best for you.  Develop a plan with your healthcare provider which may include nicotine replacement, prescription medication and/or counseling.  2. Get Started - [place] a start date on your calendar.  Remove cigarettes and ashtrays from your home, car, and office.  Dont be around other smokers.  Stop  smokingnot even a puff!  3. Support - Talk to family, friends, and co-workers about your plan to stop smoking.  Ask them not to smoke around you.  4. Coping Strategies  The four As to help during tough times:  a. Avoid - Avoid other smokers or places where smoking is commonplace.  Avoid alcoholic beverages as these may increase your desire to smoke b. Alter - Change your routine.  Drink water/juices instead of alcohol or coffee.  Take a walk or visit with someone during your coffee break.  Change your route to work. c. Alternatives - Substitute raw vegetables like carrot sticks or celery, sugarless candy or gum for the habit of having a cigarette. d. Activities - Start an exercise program (talk to your doctor prior to beginning any exercise program). Try out a new hands on hobby to distract you from smoking and to keep your hands busy like woodworking or needlepoint.   Additional tips for specific withdrawal symptoms:   Cravings for tobacco:   Distract yourself        Deep-breathing exercises        Remember that cravings are brief    Irritability:    Take a few slow, deep breaths       Soak in a hot bath    Insomnia:    Take a  walk several hours before bedtime       Avoid caffeinated beverages after noon       Read a book       Take a warm bath       Banana or warm milk    Increased appetite:   Drink water or low-calorie drinks       Make a survival Kit: Include straws, cinnamon        sticks,        coffee stirrers, licorice, toothpicks, gum, or fresh         vegetables    Inability to concentrate:  Take a brisk walk       Lighten your schedule for a couple of days       Take more breaks   Fatigue:    Get a good nights sleep       Take a nap       Dont overdo it for 2-4 weeks    Constipation, gas,  stomach pain:    Drink plenty of fluids       Increase fiber: fruit, raw vegetables, whole grain        cereals       Talk to your doctor about  diet changes    5. Dealing with Relapses - Most relapses occur within the first 2-3 months.  This is common so dont be discouraged.  Some people may take several attempts before they can quit smoking completely.  6. Reward yourself - Set-up a rewards program for every milestone, like 1st month after quitting, 3rd month after quitting, and 6th month after quitting to keep you motivated.  What your doctor can do: Perform a physical exam and order diagnostic tests like laboratory blood work and a chest X-ray.  This will help to identify health related conditions that might benefit from smoking cessation. Review your health history to make sure there are no contraindications with specific smoking cessation aids like allergies to medications or ingredients in these medications or conflicts with your current medications. Prescribe smoking cessation aids such as: Over-the-counter aid:  Nicotine gum, Nicotine patch Prescription aids:  Nicotine spray, Nicotine inhaler, or Bupropion SR (non-nicotine). Offer or recommend individual or group counseling to support you during the initial quitting and maintenance phase of your smoking cessation program. Offer or recommend other alternative treatments like hypnosis or acupuncture.  What you can expect: Benefits from quitting smoking:  Improved Physical Appearance - Minimizes or stops premature wrinkling of skin, bad breath, stained teeth, gum disease, clothes/hair smoke odors, and yellow fingernails  Improved Daily Activities - Food tastes better, sense of smell improves, and decreases shortness of breath during ordinary activities like climbing stairs, walking, and performing light housework  Decreased Financial Cost - From both no longer purchasing cigarettes and the health care cost for medical treatment of conditions caused by smoking.  Health of Others - Decreases risk of exposing others to the effects of second hand smoke.  Sets an example for  youth. Benefits of Quitting according to research from the U.S. Surgeon General:  20 minutes after:  Blood pressure lowers & Body temperature normalizes  8 hours after: Carbon monoxide levels begins to normalize   24 hours after: Heart attack risk decreases  2 weeks to 3 months after:  Blood flow improves & lung function increases   1 to 9 months after:  Coughing, sinus congestion, fatigue, shortness of breath decrease   1 year after: Risk of developing coronary heart  disease is half that of a smoker  5 years after: Risk of a stroke decreases to that of a nonsmoker  10 years after: Risk of death due to lung cancer death is halved.  Risk of oral, throat, esophagus, bladder, kidney, and pancreatic cancer decreases.  15 years after: Risk of developing coronary heart disease is half that of a nonsmoker      References:  Here are references that can provide additional information and support:  [place] (800) ACS-2345       (800) Q2878766 or (800) 7263301950 www.cancer.org       www.amhrt.Pharmacist, community Academy of [employer] Acupuncture 508-852-2777 or 249-649-7101  (800279-035-0691 or (870)746-5733 www.lungusa.org       www.medicalacupuncture.[place]     Office on [place] for Disease Control and Prevention (800) 4-CANCER or (800) Y5278638  726-156-6480 www.cancer.gov       VoipPolicy.ch  Nicotine Anonymous      Smokefree.gov 808-006-8708) TRY-NICA 430-606-5404)   807 530 5186) 44U-QUIT 818 371 3868) www.nicotine-anonymous.org   www.smokefree.gov  Contact your doctor or pharmacist if you have specific questions about starting a smoking cessation program.

## 2014-09-16 NOTE — ED Notes (Signed)
Pt  Has  Symptoms  Of  Back  Pain   Body  Aches      Chills       With  onset of  Symptoms     For  About  1  Week        Pt  Reports   Fever  At  Home  As  Well

## 2014-09-16 NOTE — ED Provider Notes (Signed)
Chief Complaint   URI   History of Present Illness   [name] is a 48 year old female who has had a one-week history of cough productive yellow-green sputum, chest tightness, chest wheezing, and chest pain. She also had nasal congestion with clear drainage, headache, sinus pressure, ear congestion, aching in her back, temperature of up to 100.6, chills, sore throat, nausea, vomiting, and diarrhea. She has had no sick exposures. She smokes three fourths of a pack of cigarettes per day.  Review of Systems   Other than as noted above, the patient denies any of the following symptoms: Systemic:  No fevers, chills, sweats, or myalgias. Eye:  No redness or discharge. ENT:  No ear pain, headache, nasal congestion, drainage, sinus pressure, or sore throat. Neck:  No neck pain, stiffness, or swollen glands. Lungs:  No cough, sputum production, hemoptysis, wheezing, chest tightness, shortness of breath or chest pain. GI:  No abdominal pain, nausea, vomiting or diarrhea.  [place]   Past medical history, family history, social history, meds, and allergies were reviewed. She is allergic to penicillin. She takes Seroquel, lithium, and mirtazapine. She's followed at [facility] for psychiatric problems.  Physical exam   Vital signs:  BP 144/80  Pulse 112  Temp(Src) 99.4 F (37.4 C) (Oral)  Resp 16  SpO2 98% General:  Alert and oriented.  In no distress.  Skin warm and dry. Eye:  No conjunctival injection or drainage. Lids were normal. ENT:  TMs and canals were normal, without erythema or inflammation.  Nasal mucosa was clear and uncongested, without drainage.  Mucous membranes were moist.  Pharynx was clear with a small amount of white exudate on the tonsils.  There were no oral ulcerations or lesions. Neck:  Supple, no adenopathy, tenderness or mass. Lungs:  No respiratory distress.  She has bilateral inspiratory wheezes and rales, scattered over both lung fields anteriorly and  posteriorly. No rhonchi, good air movement.  Heart:  Regular rhythm, without gallops, murmers or rubs. Skin:  Clear, warm, and dry, without rash or lesions.  Labs   Results for orders placed during the hospital encounter of 09/16/14  POCT RAPID STREP A ([facility] URG CARE ONLY)      Result Value Ref Range   Streptococcus, Group A Screen (Direct) NEGATIVE  NEGATIVE     Radiology   Dg Chest 2 View  09/16/2014   CLINICAL DATA:  Productive cough for 1 week.  EXAM: CHEST  2 VIEW  COMPARISON: None.  FINDINGS: The heart size and mediastinal contours are within normal limits. Both lungs are clear. The visualized skeletal structures are unremarkable.  IMPRESSION: Negative two view chest radiograph   Electronically Signed   By: Lawrence Santiago M.D.   On: 09/16/2014 09:20   Assessment     The encounter diagnosis was Acute bronchitis, unspecified organism.  No evidence of pneumonia or strep.  Plan    1.  Meds:  The following meds were prescribed:   New Prescriptions   ALBUTEROL (PROVENTIL HFA;VENTOLIN HFA) 108 (90 BASE) MCG/ACT INHALER    Inhale 2 puffs into the lungs 4 (four) times daily.   DOXYCYCLINE (VIBRA-TABS) 100 MG TABLET    Take 1 tablet (100 mg total) by mouth 2 (two) times daily.   GUAIFENESIN-CODEINE (ROBITUSSIN AC) 100-10 MG/5ML SYRUP    Take 5 mLs by mouth 3 (three) times daily as needed for cough.   ONDANSETRON (ZOFRAN ODT) 8 MG DISINTEGRATING TABLET    Take 1 tablet (8 mg total) by mouth  every 8 (eight) hours as needed for nausea.   PREDNISONE (DELTASONE) 20 MG TABLET    Take 1 tablet (20 mg total) by mouth 2 (two) times daily.    2.  Patient Education/Counseling:  The patient was given appropriate handouts, self care instructions, and instructed in symptomatic relief.  Instructed to get extra fluids and extra rest.    3.  Follow up:  The patient was told to follow up here if no better in 3 to 4 days, or sooner if becoming worse in any way, and given some red flag symptoms such as  increasing fever, difficulty breathing, chest pain, or persistent vomiting which would prompt immediate return.       Harden Mo, MD 09/16/14 [phone]

## 2014-09-18 LAB — CULTURE, GROUP A STREP

## 2014-09-28 ENCOUNTER — Ambulatory Visit: Payer: Self-pay | Admitting: Family Medicine

## 2014-09-30 ENCOUNTER — Encounter: Payer: Self-pay | Admitting: Family Medicine

## 2014-09-30 ENCOUNTER — Ambulatory Visit (INDEPENDENT_AMBULATORY_CARE_PROVIDER_SITE_OTHER): Payer: Self-pay | Admitting: Family Medicine

## 2014-09-30 VITALS — BP 104/64 | HR 103 | Temp 98.3°F | Ht 64.75 in | Wt 121.2 lb

## 2014-09-30 DIAGNOSIS — J069 Acute upper respiratory infection, unspecified: Secondary | ICD-10-CM | POA: Insufficient documentation

## 2014-09-30 DIAGNOSIS — F319 Bipolar disorder, unspecified: Secondary | ICD-10-CM

## 2014-09-30 MED ORDER — BENZONATATE 200 MG PO CAPS: 200.0000 mg | ORAL_CAPSULE | Freq: Three times a day (TID) | ORAL | Status: DC | PRN

## 2014-09-30 NOTE — Progress Notes (Signed)
Patient ID: [name], female   DOB: [birth year], 48 y.o.   MRN: [id number]   Subjective:    Patient ID: [name], female    DOB: [birth year], 48 y.o.   MRN: [id number]  HPI  Patient presents for Same Day Appointment  CC: f/u for cough  # Cough:  Started 2 weeks ago, went to ED at that time and was given doxycycline, prednisone, zofran, OTC cough meds. She took her doxcycline as prescribed and recently finished it.  She reports being much improved. She is not having any difficulty with breathing.  She still has a cough and wants to make sure she doesn't get worse. Mostly nonproductive at this point.  Nasal congestion present but improved. ROS: no fevers/chills, no CP, no SOB, +nausea, no vomiting, no diarrhea  # Bipolar:  Requests lithium level check today  Followed by Beverly Sessions, says her next appt is next month.  Review of Systems   See HPI for ROS. All other systems reviewed and are negative.  Past medical history, surgical, family, and social history reviewed and updated in the EMR as appropriate.  Objective:  BP 104/64  Pulse 103  Temp(Src) 98.3 F (36.8 C) (Oral)  Ht 5' 4.75" (1.645 m)  Wt 121 lb 3.2 oz (54.976 kg)  BMI 20.32 kg/m2 Vitals reviewed  General: NAD HEENT: PERRL, EOMI. MMM. No oropharyngeal erythema or exudate.  CV: RRR, normal s1 and s2, no murmurs. Resp: Clear bilaterally, normal effort. No w/r/c Ext: no edema or cyanosis.  Psych: pleasant, normal tone and prosody of speech.  Assessment & Plan:  See Problem List Documentation

## 2014-09-30 NOTE — Assessment & Plan Note (Signed)
Normal exam today. Already took antibiotic and steroid course(?). Reassured patient regarding improved symptoms that may persist for >1 month. Continue OTC cough meds, prescribed tessalon for additional symptom relief. Return precautions, f/u 1 month if still not improved or worse breathing status.

## 2014-09-30 NOTE — Patient Instructions (Signed)
The cough you are having is most likely viral. Your exam today is normal. The cough can continue for many weeks (sometimes 4-6 weeks). Continue with your over the counter cough medications, and you can also try the tessalon I prescribed today.  If your breathing worsens, you are still sick after 1 month, return to clinic to be re-evaluated.  Your symptoms are due to a viral illness. Antibiotics will not help improve your symptoms, but the following will help you feel better while your body fights the virus.   Drink lots of water (Guaifenesin "Mucinex")  Nasal Saline Spray  Congestion:   Nose spray: Afrin (Phenylephrine). DO NOT USE MORE THAN 3 DAYS  Oral: Pseudoephedrine  Sneezing & Runny nose: Antihistamines: Zyrtec, Claritin, Allegra  Pain/Sore throat: Tylenol, Ibuprofen  Cough: Dextromethorphan  Wash your hands often to prevent spreading the virus

## 2014-09-30 NOTE — Assessment & Plan Note (Signed)
Pt requesting lithium level check. She states she usually gets this "every 3 months" at our clinic and they are faxed to [facility], where she is prescribed the medication. Epic chart review shows 3 level checks in our system, 06/2013, 2013, and 2012. I asked her to check with [facility] at her next appt whether her labs should be checked there as they are prescribing the medication. Lithium level checked today in clinic and will fax to [facility] after information release signed.

## 2014-10-01 LAB — LITHIUM LEVEL: Lithium Lvl: 0.6 mEq/L — ABNORMAL LOW (ref 0.80–1.40)

## 2014-10-05 ENCOUNTER — Encounter: Payer: Self-pay | Admitting: Family Medicine

## 2014-12-11 ENCOUNTER — Other Ambulatory Visit: Payer: Self-pay | Admitting: Family Medicine

## 2015-01-06 ENCOUNTER — Ambulatory Visit: Payer: Self-pay | Admitting: Family Medicine

## 2015-01-13 ENCOUNTER — Encounter: Payer: Self-pay | Admitting: Family Medicine

## 2015-01-13 ENCOUNTER — Ambulatory Visit (HOSPITAL_COMMUNITY)
Admission: RE | Admit: 2015-01-13 | Discharge: 2015-01-13 | Disposition: A | Discharge status: Home / Self Care | Payer: Self-pay | Source: Ambulatory Visit | Attending: Family Medicine | Admitting: Family Medicine

## 2015-01-13 ENCOUNTER — Ambulatory Visit (INDEPENDENT_AMBULATORY_CARE_PROVIDER_SITE_OTHER): Payer: Self-pay | Admitting: Family Medicine

## 2015-01-13 VITALS — BP 140/87 | HR 116 | Temp 98.5°F | Ht 64.0 in | Wt 123.0 lb

## 2015-01-13 DIAGNOSIS — R000 Tachycardia, unspecified: Secondary | ICD-10-CM | POA: Insufficient documentation

## 2015-01-13 DIAGNOSIS — F319 Bipolar disorder, unspecified: Secondary | ICD-10-CM

## 2015-01-13 DIAGNOSIS — D229 Melanocytic nevi, unspecified: Secondary | ICD-10-CM

## 2015-01-13 LAB — COMPREHENSIVE METABOLIC PANEL
ALK PHOS: 134 U/L — AB (ref 39–117)
ALT: 9 U/L (ref 0–35)
AST: 13 U/L (ref 0–37)
Albumin: 4.7 g/dL (ref 3.5–5.2)
BUN: 7 mg/dL (ref 6–23)
CALCIUM: 10.6 mg/dL — AB (ref 8.4–10.5)
CO2: 24 mEq/L (ref 19–32)
Chloride: 98 mEq/L (ref 96–112)
Creat: 0.86 mg/dL (ref 0.50–1.10)
GLUCOSE: 74 mg/dL (ref 70–99)
POTASSIUM: 3.9 meq/L (ref 3.5–5.3)
SODIUM: 135 meq/L (ref 135–145)
Total Bilirubin: 0.3 mg/dL (ref 0.2–1.2)
Total Protein: 7.3 g/dL (ref 6.0–8.3)

## 2015-01-13 LAB — CBC WITH DIFFERENTIAL/PLATELET
Basophils Absolute: 0 10*3/uL (ref 0.0–0.1)
Basophils Relative: 0 % (ref 0–1)
Eosinophils Absolute: 0.2 10*3/uL (ref 0.0–0.7)
Eosinophils Relative: 1 % (ref 0–5)
HCT: 42 % (ref 36.0–46.0)
HEMOGLOBIN: 13.6 g/dL (ref 12.0–15.0)
LYMPHS ABS: 5.2 10*3/uL — AB (ref 0.7–4.0)
Lymphocytes Relative: 32 % (ref 12–46)
MCH: 25.7 pg — AB (ref 26.0–34.0)
MCHC: 32.4 g/dL (ref 30.0–36.0)
MCV: 79.4 fL (ref 78.0–100.0)
MPV: 9.7 fL (ref 8.6–12.4)
Monocytes Absolute: 1 10*3/uL (ref 0.1–1.0)
Monocytes Relative: 6 % (ref 3–12)
NEUTROS ABS: 9.9 10*3/uL — AB (ref 1.7–7.7)
NEUTROS PCT: 61 % (ref 43–77)
Platelets: 412 10*3/uL — ABNORMAL HIGH (ref 150–400)
RBC: 5.29 MIL/uL — ABNORMAL HIGH (ref 3.87–5.11)
RDW: 15.6 % — ABNORMAL HIGH (ref 11.5–15.5)
WBC: 16.2 10*3/uL — ABNORMAL HIGH (ref 4.0–10.5)

## 2015-01-13 NOTE — Assessment & Plan Note (Signed)
New mole on upper back just above what appears to be a keratosis Offered excision today as it has just popped up The last carefully month, however she would like to watch and wait Measured carefully, asked her to follow-up in 6-8 weeks.

## 2015-01-13 NOTE — Patient Instructions (Signed)
Great to see you!  Lets keep an eye on that mole, please come back in about 6 weeks.   Nonspecific Tachycardia Tachycardia is a faster than normal heartbeat (more than 100 beats per minute). In adults, the heart normally beats between 60 and 100 times a minute. A fast heartbeat may be a normal response to exercise or stress. It does not necessarily mean that something is wrong. However, sometimes when your heart beats too fast it may not be able to pump enough blood to the rest of your body. This can result in chest pain, shortness of breath, dizziness, and even fainting. Nonspecific tachycardia means that the specific cause or pattern of your tachycardia is unknown. CAUSES  Tachycardia may be harmless or it may be due to a more serious underlying cause. Possible causes of tachycardia include:  Exercise or exertion.  Fever.  Pain or injury.  Infection.  Loss of body fluids (dehydration).  Overactive thyroid.  Lack of red blood cells (anemia).  Anxiety and stress.  Alcohol.  Caffeine.  Tobacco products.  Diet pills.  Illegal drugs.  Heart disease. SYMPTOMS  Rapid or irregular heartbeat (palpitations).  Suddenly feeling your heart beating (cardiac awareness).  Dizziness.  Tiredness (fatigue).  Shortness of breath.  Chest pain.  Nausea.  Fainting. DIAGNOSIS  Your caregiver will perform a physical exam and take your medical history. In some cases, a heart specialist (cardiologist) may be consulted. Your caregiver may also order:  Blood tests.  Electrocardiography. This test records the electrical activity of your heart.  A heart monitoring test. TREATMENT  Treatment will depend on the likely cause of your tachycardia. The goal is to treat the underlying cause of your tachycardia. Treatment methods may include:  Replacement of fluids or blood through an intravenous (IV) tube for moderate to severe dehydration or anemia.  New medicines or changes in your  current medicines.  Diet and lifestyle changes.  Treatment for certain infections.  Stress relief or relaxation methods. HOME CARE INSTRUCTIONS   Rest.  Drink enough fluids to keep your urine clear or pale yellow.  Do not smoke.  Avoid:  Caffeine.  Tobacco.  Alcohol.  Chocolate.  Stimulants such as over-the-counter diet pills or pills that help you stay awake.  Situations that cause anxiety or stress.  Illegal drugs such as marijuana, phencyclidine (PCP), and cocaine.  Only take medicine as directed by your caregiver.  Keep all follow-up appointments as directed by your caregiver. SEEK IMMEDIATE MEDICAL CARE IF:   You have pain in your chest, upper arms, jaw, or neck.  You become weak, dizzy, or feel faint.  You have palpitations that will not go away.  You vomit, have diarrhea, or pass blood in your stool.  Your skin is cool, pale, and wet.  You have a fever that will not go away with rest, fluids, and medicine. MAKE SURE YOU:   Understand these instructions.  Will watch your condition.  Will get help right away if you are not doing well or get worse. Document Released: 12/28/2004 Document Revised: 02/12/2012 Document Reviewed: 10/31/2011 [facility] Patient Information 2015 [place], [place]. This information is not intended to replace advice given to you by your health care provider. Make sure you discuss any questions you have with your health care provider.

## 2015-01-13 NOTE — Progress Notes (Signed)
Patient ID: [name], female   DO B: 08-10-1966, 49 y.o.   MRN: [id number]   HPI  Patient presents today for evaluation of a mole, follow-up tachycardia  Tachycardia Asymptomatic mostly, no symptoms at rest Comes on with exercise or being angry No syncope no palpitations other tahn with above activities  Mole Has had a longstanding mole on back that is not changing,l 1 more mole appeared about 1 month ago  She denies bleeding, itching, or irritation at the new mole She would like to watch and wait  Smoking status noted ROS: Per HPI  Objective: BP 140/87 mmHg  Pulse 116  Temp(Src) 98.5 F (36.9 C) (Oral)  Ht 5\' 4"  (1.626 m)  Wt 123 lb (55.792 kg)  BMI 21.10 kg/m2 Gen: NAD, alert, cooperative with exam HEENT: NCAT CV: RRR, good S1/S2, no murmur Resp: CTABL, no wheezes, non-labored Abd: SNTND, BS present, no guarding or organomegaly Neuro: Alert and oriented, No gross deficits  Skin: 15mm by 4 mm flat hyperpigmented lesion on upper back with not well defined borders,  Below this is a 36mm by 5 mm raise hyperpigmented papule consistent with a seborrheic keratosis  EKG with sinus tachycardia  Assessment and plan:  Tachycardia EKG today, repeat TSH and CBC EKG with sinus tach, discussed red flags Continue to follow closely    Benign mole New mole on upper back just above what appears to be a keratosis Offered excision today as it has just popped up The last carefully month, however she would like to watch and wait Measured carefully, asked her to follow-up in 6-8 weeks.     Orders Placed This Encounter  Procedures  . TSH  . CBC with Differential  . Comprehensive metabolic panel  . T4, Free  . EKG 12-Lead    No orders of the defined types were placed in this encounter.

## 2015-01-13 NOTE — Assessment & Plan Note (Addendum)
EKG today, repeat TSH and CBC EKG with sinus tach, discussed red flags Continue to follow closely

## 2015-01-14 LAB — TSH: TSH: 4.191 u[IU]/mL (ref 0.350–4.500)

## 2015-01-14 LAB — T4, FREE: FREE T4: 0.91 ng/dL (ref 0.80–1.80)

## 2015-01-15 ENCOUNTER — Telehealth: Payer: Self-pay | Admitting: Family Medicine

## 2015-01-15 ENCOUNTER — Encounter: Payer: Self-pay | Admitting: Family Medicine

## 2015-01-15 NOTE — Telephone Encounter (Signed)
Called to discuss recent results. However her voicemail box has not set up, I called the other number on the chart for [name] who states that he does not live with her, does not have her phone number, and cannot immediately get in contact with her.  The main purpose of my call was to let her know that her EKG doesn't fact have signs of left ventricular hypertrophy and I think we should do a TTE. Seeing as how high cannot get a hold of her. I will send her a letter and ask her to follow-up.  Laroy Apple, MD [place] Resident, PGY-3 01/15/2015, 12:33 PM

## 2015-01-20 ENCOUNTER — Ambulatory Visit (INDEPENDENT_AMBULATORY_CARE_PROVIDER_SITE_OTHER): Payer: Self-pay | Admitting: Family Medicine

## 2015-01-20 ENCOUNTER — Encounter: Payer: Self-pay | Admitting: Family Medicine

## 2015-01-20 VITALS — BP 130/82 | HR 110 | Temp 98.1°F | Ht 64.0 in | Wt 120.0 lb

## 2015-01-20 DIAGNOSIS — I517 Cardiomegaly: Secondary | ICD-10-CM

## 2015-01-20 NOTE — Assessment & Plan Note (Signed)
Seen on EKG without Hx of HTN TTE, consider cardiology

## 2015-01-20 NOTE — Progress Notes (Signed)
Patient ID: [name], female   DOB: [birth year], 49 y.o.   MRN: [id number]   HPI  Patient presents today for follow-up tachycardia  Patient is a long history of persistent tachycardia in our clinic. After discussion with her she's asymptomatic, denying lightheadedness, dizziness, or other symptoms during the episodes of tachycardia. She walks to our clinic which may explain that her heart rate as well as elevated.  On her last visit she had an EKG which showed signs of LVH. I could not contact her on the phone so asked her to follow-up in clinic. She denies any family history of sudden cardiac death.  Calcium Had elevated calcium on her labs after last visit, she states that she drinks 5 containers of milk daily.   Smoking status noted ROS: Per HPI  Objective: BP 130/82 mmHg  Pulse 110  Temp(Src) 98.1 F (36.7 C) (Oral)  Ht 5\' 4"  (1.626 m)  Wt 120 lb (54.432 kg)  BMI 20.59 kg/m2 Gen: NAD, alert, cooperative with exam HEENT: NCAT CV: RRR, good S1/S2, no murmur Resp: CTABL, no wheezes, non-labored Ext: No edema, warm Neuro: Alert and oriented, No gross deficits  Assessment and plan:  LVH (left ventricular hypertrophy) Seen on EKG without Hx of HTN TTE, consider cardiology   Hypercalcemia Mild hypercalcemia with history of elevated calcium intake Repeat and check parathyroid hormone and PTH RP     Orders Placed This Encounter  Procedures  . PTH, Intact and Calcium  . PTH-Related Peptide  . 2D Echocardiogram with contrast    LVH on EKG    Standing Status: Future     Number of Occurrences:      Standing Expiration Date: 01/21/2016    Order Specific Question:  Type of Echo    Answer:  Complete    Order Specific Question:  Where should this test be performed    Answer:  [name]    Order Specific Question:  Reason for exam-Echo    Answer:  Other - See Comments Section

## 2015-01-20 NOTE — Patient Instructions (Signed)
Great to see you, we will set up an echocardiogram for you  I will contact you about your labs.   Nonspecific Tachycardia Tachycardia is a faster than normal heartbeat (more than 100 beats per minute). In adults, the heart normally beats between 60 and 100 times a minute. A fast heartbeat may be a normal response to exercise or stress. It does not necessarily mean that something is wrong. However, sometimes when your heart beats too fast it may not be able to pump enough blood to the rest of your body. This can result in chest pain, shortness of breath, dizziness, and even fainting. Nonspecific tachycardia means that the specific cause or pattern of your tachycardia is unknown. CAUSES  Tachycardia may be harmless or it may be due to a more serious underlying cause. Possible causes of tachycardia include:  Exercise or exertion.  Fever.  Pain or injury.  Infection.  Loss of body fluids (dehydration).  Overactive thyroid.  Lack of red blood cells (anemia).  Anxiety and stress.  Alcohol.  Caffeine.  Tobacco products.  Diet pills.  Illegal drugs.  Heart disease. SYMPTOMS  Rapid or irregular heartbeat (palpitations).  Suddenly feeling your heart beating (cardiac awareness).  Dizziness.  Tiredness (fatigue).  Shortness of breath.  Chest pain.  Nausea.  Fainting. DIAGNOSIS  Your caregiver will perform a physical exam and take your medical history. In some cases, a heart specialist (cardiologist) may be consulted. Your caregiver may also order:  Blood tests.  Electrocardiography. This test records the electrical activity of your heart.  A heart monitoring test. TREATMENT  Treatment will depend on the likely cause of your tachycardia. The goal is to treat the underlying cause of your tachycardia. Treatment methods may include:  Replacement of fluids or blood through an intravenous (IV) tube for moderate to severe dehydration or anemia.  New medicines or changes  in your current medicines.  Diet and lifestyle changes.  Treatment for certain infections.  Stress relief or relaxation methods. HOME CARE INSTRUCTIONS   Rest.  Drink enough fluids to keep your urine clear or pale yellow.  Do not smoke.  Avoid:  Caffeine.  Tobacco.  Alcohol.  Chocolate.  Stimulants such as over-the-counter diet pills or pills that help you stay awake.  Situations that cause anxiety or stress.  Illegal drugs such as marijuana, phencyclidine (PCP), and cocaine.  Only take medicine as directed by your caregiver.  Keep all follow-up appointments as directed by your caregiver. SEEK IMMEDIATE MEDICAL CARE IF:   You have pain in your chest, upper arms, jaw, or neck.  You become weak, dizzy, or feel faint.  You have palpitations that will not go away.  You vomit, have diarrhea, or pass blood in your stool.  Your skin is cool, pale, and wet.  You have a fever that will not go away with rest, fluids, and medicine. MAKE SURE YOU:   Understand these instructions.  Will watch your condition.  Will get help right away if you are not doing well or get worse. Document Released: 12/28/2004 Document Revised: 02/12/2012 Document Reviewed: 10/31/2011 [facility] Patient Information 2015 [place], [place]. This information is not intended to replace advice given to you by your health care provider. Make sure you discuss any questions you have with your health care provider.

## 2015-01-20 NOTE — Assessment & Plan Note (Signed)
Mild hypercalcemia with history of elevated calcium intake Repeat and check parathyroid hormone and PTH RP

## 2015-01-21 LAB — PTH, INTACT AND CALCIUM
Calcium: 10.2 mg/dL (ref 8.4–10.5)
PTH: 56 pg/mL (ref 14–64)

## 2015-01-22 ENCOUNTER — Telehealth: Payer: Self-pay | Admitting: Family Medicine

## 2015-01-22 NOTE — Telephone Encounter (Signed)
Call about the electrocardiogram appt

## 2015-01-22 NOTE — Telephone Encounter (Signed)
Appointment scheduled at [facility] for 2/24 at 1pm. Tried calling to inform patient but no answer and vm was full, will try again later.

## 2015-01-22 NOTE — Telephone Encounter (Signed)
Called again, this time I was able to leave appointment details on vm, asked that patient call back with any questions.

## 2015-01-22 NOTE — Telephone Encounter (Signed)
Will ask nursing to call and discuss appt for echo.   Laroy Apple, MD [place] Resident, PGY-3 01/22/2015, 1:33 PM

## 2015-01-27 ENCOUNTER — Ambulatory Visit (HOSPITAL_COMMUNITY)
Admission: RE | Admit: 2015-01-27 | Discharge: 2015-01-27 | Disposition: A | Discharge status: Home / Self Care | Payer: Self-pay | Source: Ambulatory Visit | Attending: Family Medicine | Admitting: Family Medicine

## 2015-01-27 ENCOUNTER — Telehealth: Payer: Self-pay | Admitting: Family Medicine

## 2015-01-27 DIAGNOSIS — I517 Cardiomegaly: Secondary | ICD-10-CM | POA: Insufficient documentation

## 2015-01-27 DIAGNOSIS — R9431 Abnormal electrocardiogram [ECG] [EKG]: Secondary | ICD-10-CM

## 2015-01-27 DIAGNOSIS — R000 Tachycardia, unspecified: Secondary | ICD-10-CM

## 2015-01-27 NOTE — Telephone Encounter (Signed)
[name] discussed recent findings with patient  Her EKG shows LVH, she also has T-wave abnormalities in leads 3, aVF, V1, V2 in the inferior and septal leads.  On her echo she has a normal EF and normal diastolic dysfunction. However, she has "possible hypokinesis of the basal inferior and inferior septal myocardium". These findings at least loosely correlate with her EKG findings. Previously I felt that her racing heart rate was likely due to both mood and exercise-induced tachycardia with her walking to our clinic. However, given these findings and slightly concerned that her persistent tachycardia could  Be causing structural abnormalities of her heart.  I discussed these findings with her, and recommended that she see cardiology for their recommendations.  I will refer her to cardiology.  Laroy Apple, MD [place] Resident, PGY-3 01/27/2015, 5:07 PM

## 2015-01-27 NOTE — Progress Notes (Signed)
  Echocardiogram 2D Echocardiogram has been performed.  Donata Clay 01/27/2015, 1:48 PM

## 2015-01-28 ENCOUNTER — Encounter: Payer: Self-pay | Admitting: Family Medicine

## 2015-01-28 LAB — PTH-RELATED PEPTIDE: PTH-Related Protein (PTH-RP): 22 pg/mL (ref 14–27)

## 2015-01-29 NOTE — Telephone Encounter (Signed)
Scheduled at [place] [facility] on Tuesday march 29th @ 8:45. Pt informed. Deseree Kennon Holter, CMA

## 2015-03-02 ENCOUNTER — Ambulatory Visit (INDEPENDENT_AMBULATORY_CARE_PROVIDER_SITE_OTHER): Payer: Self-pay | Admitting: Cardiovascular Disease

## 2015-03-02 ENCOUNTER — Encounter: Payer: Self-pay | Admitting: Cardiovascular Disease

## 2015-03-02 VITALS — BP 102/74 | HR 108 | Ht 64.0 in | Wt 122.1 lb

## 2015-03-02 DIAGNOSIS — R002 Palpitations: Secondary | ICD-10-CM | POA: Insufficient documentation

## 2015-03-02 DIAGNOSIS — R000 Tachycardia, unspecified: Secondary | ICD-10-CM

## 2015-03-02 HISTORY — DX: Palpitations: R00.2

## 2015-03-02 NOTE — Patient Instructions (Signed)
Your physician recommends that you continue on your current medications as directed. Please refer to the Current Medication list given to you today.  Your physician recommends that you schedule a follow-up appointment in: as needed with Dr. Nahser  

## 2015-03-02 NOTE — Progress Notes (Signed)
Cardiology Office Note   Date:  03/02/2015   ID:  [name], DOB 10/27/1966, MRN [id number]  PCP:  Kenn File, MD  Cardiologist:   Thayer Headings, MD   Chief Complaint  Patient presents with  . Palpitations   Problem List: 1. Palpitations   History of Present Illness: [name] is a 49 y.o. female who presents for evaluation of palpitations. She has had the palps for the past 1-2 months. Occurs a various times. Occurs when she is arguing frequently .  Seem to be stress related.  She delivers papers to stores  For work.   Able to do her job without any difficulty.     Past Medical History  Diagnosis Date  . Depression   . Bipolar affective   . Anxiety     Past Surgical History  Procedure Laterality Date  . Neck surgery       Current Outpatient Prescriptions  Medication Sig Dispense Refill  . albuterol (PROVENTIL HFA;VENTOLIN HFA) 108 (90 BASE) MCG/ACT inhaler Inhale 2 puffs into the lungs 4 (four) times daily. 1 Inhaler 0  . clonazePAM (KLONOPIN) 0.5 MG tablet TAKE 1 TABLET AS NEEDED 15 tablet 1  . cyclobenzaprine (FLEXERIL) 10 MG tablet TAKE ONE TABLET TWICE DAILY AS NEEDED FOR MUSCLE SPASM 60 tablet 0  . gabapentin (NEURONTIN) 300 MG capsule TAKE 1 CAPSULE (300 MG TOTAL) BY MOUTH 3 (THREE) TIMES DAILY. 90 capsule 1  . lithium carbonate 300 MG capsule Take 1 capsule (300 mg total) by mouth 2 (two) times daily with a meal. 60 capsule 0  . mirtazapine (REMERON) 30 MG tablet Take 1 tablet (30 mg total) by mouth at bedtime. 30 tablet 0  . QUEtiapine (SEROQUEL) 300 MG tablet Take 1 tablet (300 mg total) by mouth 2 (two) times daily. 60 tablet 0   No current facility-administered medications for this visit.    Allergies:   Penicillins    Social History:  The patient  reports that she has been smoking Cigarettes.  She has been smoking about 0.80 packs per day. She does not have any smokeless tobacco history on file. She reports that she drinks about  3.0 oz of alcohol per week. She reports that she uses illicit drugs (Marijuana) about once per week.   Family History:  The patient's family history is not on file.    ROS:  Please see the history of present illness.    Review of Systems: Constitutional:  denies fever, chills, diaphoresis, appetite change and fatigue.  HEENT: denies photophobia, eye pain, redness, hearing loss, ear pain, congestion, sore throat, rhinorrhea, sneezing, neck pain, neck stiffness and tinnitus.  Respiratory: denies SOB, DOE, cough, chest tightness, and wheezing.  Cardiovascular: admits to  , palpitations, denies  leg swelling.  Gastrointestinal: denies nausea, vomiting, abdominal pain, diarrhea, constipation, blood in stool.  Genitourinary: denies dysuria, urgency, frequency, hematuria, flank pain and difficulty urinating.  Musculoskeletal: denies  myalgias, back pain, joint swelling, arthralgias and gait problem.   Skin: denies pallor, rash and wound.  Neurological: denies dizziness, seizures, syncope, weakness, light-headedness, numbness and headaches.   Hematological: denies adenopathy, easy bruising, personal or family bleeding history.  Psychiatric/ Behavioral: denies suicidal ideation, mood changes, confusion, nervousness, sleep disturbance and agitation.       All other systems are reviewed and negative.    PHYSICAL EXAM: VS:  BP 102/74 mmHg  Pulse 108  Ht 5\' 4"  (1.626 m)  Wt 122 lb 1.9 oz (55.393 kg)  BMI 20.95  kg/m2 , BMI Body mass index is 20.95 kg/(m^2). GEN: Well nourished, well developed, in no acute distress HEENT: normal Neck: no JVD, carotid bruits, or masses Cardiac: RRR; no murmurs, rubs, or gallops,no edema  Respiratory:  clear to auscultation bilaterally, normal work of breathing GI: soft, nontender, nondistended, + BS MS: no deformity or atrophy Skin: warm and dry, no rash Neuro:  Strength and sensation are intact Psych: normal   EKG:  EKG is not ordered today. The ekg  ordered today demonstrates    Recent Labs: 01/13/2015: ALT 9; BUN 7; Creatinine 0.86; Hemoglobin 13.6; Platelets 412*; Potassium 3.9; Sodium 135; TSH 4.191    Lipid Panel    Component Value Date/Time   CHOL 235* 06/17/2013 1604   TRIG 220* 06/17/2013 1604   HDL 45 06/17/2013 1604   CHOLHDL 5.2 06/17/2013 1604   VLDL 44* 06/17/2013 1604   LDLCALC 146* 06/17/2013 1604   LDLDIRECT 213* 03/11/2009 2019      Wt Readings from Last 3 Encounters:  03/02/15 122 lb 1.9 oz (55.393 kg)  01/20/15 120 lb (54.432 kg)  01/13/15 123 lb (55.792 kg)      Other studies Reviewed: Additional studies/ records that were reviewed today include: . Review of the above records demonstrates:    ASSESSMENT AND PLAN:  1.  Palpitations: Clinically, her palpitations sound like premature ventricular contractions. I think these are benign. She typically has seizures when she's under stress and has them frequently when she's arguing. She drinks 5 glasses of chocolate milk every day. I've cautioned her that this means that she's drinking a fairly excessive amount of caffeine and sugar. I've advised her to decrease her chocolate milk intake significantly .  She is mildly tachycardic at rest. I think that this probably is related to her sugar intake and caffeine intake. It also may be related to some of her other medications. We could consider starting her on a very low-dose beta blocker but I note that she also has a history of depression and beta blocker therapy may worsen her depression. Addition, she also has a history of reactive airways disease and takes albuterol. The beta blocker would also potentially interfere with that. I will leave the decision to start a low-dose beta blocker to her family physician at this time. I think that her tachycardia will resolve if she stays better hydrated and decreases her caffeine intake.  Her echo card gram shows normal left ventricle systolic function. She was noted have  some mild wall motion adenopathies but I think these are normal.   Current medicines are reviewed at length with the patient today.  The patient does not have concerns regarding medicines.  The following changes have been made:  no change  Labs/ tests ordered today include:  No orders of the defined types were placed in this encounter.     Disposition:   FU with me as needed.     Signed, Tyra Gural, Wonda Cheng, MD  03/02/2015 9:05 AM    [address], [place], [place]  [address] Phone: [phone]; Fax: [phone]

## 2015-06-21 ENCOUNTER — Other Ambulatory Visit: Payer: Self-pay | Admitting: Family Medicine

## 2015-07-03 ENCOUNTER — Emergency Department (HOSPITAL_COMMUNITY)
Admission: EM | Admit: 2015-07-03 | Discharge: 2015-07-03 | Disposition: A | Discharge status: Home / Self Care | Payer: Self-pay | Attending: Emergency Medicine | Admitting: Emergency Medicine

## 2015-07-03 ENCOUNTER — Encounter (HOSPITAL_COMMUNITY): Payer: Self-pay | Admitting: Emergency Medicine

## 2015-07-03 DIAGNOSIS — F319 Bipolar disorder, unspecified: Secondary | ICD-10-CM | POA: Insufficient documentation

## 2015-07-03 DIAGNOSIS — G43809 Other migraine, not intractable, without status migrainosus: Secondary | ICD-10-CM | POA: Insufficient documentation

## 2015-07-03 DIAGNOSIS — Z880 Allergy status to penicillin: Secondary | ICD-10-CM | POA: Insufficient documentation

## 2015-07-03 DIAGNOSIS — Z720 Tobacco use: Secondary | ICD-10-CM | POA: Insufficient documentation

## 2015-07-03 DIAGNOSIS — Z79899 Other long term (current) drug therapy: Secondary | ICD-10-CM | POA: Insufficient documentation

## 2015-07-03 DIAGNOSIS — F419 Anxiety disorder, unspecified: Secondary | ICD-10-CM | POA: Insufficient documentation

## 2015-07-03 MED ORDER — METOCLOPRAMIDE HCL 5 MG/ML IJ SOLN
10.0000 mg | Freq: Once | INTRAMUSCULAR | Status: AC
  Administered 2015-07-03: 10 mg via INTRAVENOUS
  Filled 2015-07-03: qty 2

## 2015-07-03 MED ORDER — KETOROLAC TROMETHAMINE 30 MG/ML IJ SOLN
30.0000 mg | Freq: Once | INTRAMUSCULAR | Status: AC
  Administered 2015-07-03: 30 mg via INTRAVENOUS
  Filled 2015-07-03: qty 1

## 2015-07-03 MED ORDER — IBUPROFEN 600 MG PO TABS: 600.0000 mg | ORAL_TABLET | Freq: Three times a day (TID) | ORAL | Status: DC | PRN

## 2015-07-03 MED ORDER — MORPHINE SULFATE 4 MG/ML IJ SOLN
4.0000 mg | Freq: Once | INTRAMUSCULAR | Status: AC
  Administered 2015-07-03: 4 mg via INTRAVENOUS
  Filled 2015-07-03: qty 1

## 2015-07-03 MED ORDER — SODIUM CHLORIDE 0.9 % IV BOLUS (SEPSIS)
1000.0000 mL | Freq: Once | INTRAVENOUS | Status: AC
  Administered 2015-07-03: 1000 mL via INTRAVENOUS

## 2015-07-03 NOTE — ED Notes (Signed)
Pt denies any changes in eating/hydration habits.  Sts she has a hx of migraines but has not had one in a long time.  Pt sts she has been trying Excedrin migraine at home, but has not had any relief.  Last dose was "two days ago"

## 2015-07-03 NOTE — ED Notes (Signed)
Pt. reports migraine headache for 2 weeks unrelieved by OTC pain medications , denies nausea or vomitting , no blurred vision or photophobia , denies fever or chills.

## 2015-07-03 NOTE — ED Notes (Signed)
Unsuccessful attempts to start iv

## 2015-07-03 NOTE — Discharge Instructions (Signed)

## 2015-07-03 NOTE — ED Notes (Signed)
This RN unable to obtain IV.  2nd RN to try.

## 2015-07-03 NOTE — ED Provider Notes (Signed)
CSN: [id number]     Arrival date & time 07/03/15  0424 History   First MD Initiated Contact with Patient 07/03/15 [phone]     Chief Complaint  Patient presents with  . Migraine     HPI Patient reports persistent headache of the past 2 weeks unrelieved with over-the-counter Excedrin.  She has a history of migraine headaches but reports been several years since her last significant headache.  No fevers or chills.  A second pain.  No head injury or trauma.  Denies abdominal pain.  No change in her vision.  Her pain is worsened with light and loud noise.  No other complaints.  Pain is moderate to severe in severity   Past Medical History  Diagnosis Date  . Depression   . Bipolar affective   . Anxiety    Past Surgical History  Procedure Laterality Date  . Neck surgery     No family history on file. History  Substance Use Topics  . Smoking status: Current Every Day Smoker -- 0.80 packs/day    Types: Cigarettes  . Smokeless tobacco: Not on file     Comment: would like to love to quitt per pt  . Alcohol Use: Yes   OB History    No data available     Review of Systems  All other systems reviewed and are negative.     Allergies  Penicillins  Home Medications   Prior to Admission medications   Medication Sig Start Date End Date Taking? Authorizing Provider  albuterol (PROVENTIL HFA;VENTOLIN HFA) 108 (90 BASE) MCG/ACT inhaler Inhale 2 puffs into the lungs 4 (four) times daily. 09/16/14  Yes Harden Mo, MD  lithium carbonate 300 MG capsule Take 1 capsule (300 mg total) by mouth 2 (two) times daily with a meal. 12/18/13  Yes Timmothy Euler, MD  mirtazapine (REMERON) 30 MG tablet Take 1 tablet (30 mg total) by mouth at bedtime. 12/18/13  Yes Timmothy Euler, MD  QUEtiapine (SEROQUEL) 300 MG tablet Take 1 tablet (300 mg total) by mouth 2 (two) times daily. 12/18/13  Yes Timmothy Euler, MD  TRAZODONE HCL PO Take 1 tablet by mouth at bedtime.   Yes Historical Provider, MD   clonazePAM (KLONOPIN) 0.5 MG tablet TAKE 1 TABLET AS NEEDED Patient not taking: Reported on 07/03/2015 06/13/13   Sharon [address], MD  cyclobenzaprine (FLEXERIL) 10 MG tablet TAKE ONE TABLET TWICE DAILY AS NEEDED FOR MUSCLE SPASM Patient not taking: Reported on 07/03/2015 12/11/14   Timmothy Euler, MD  gabapentin (NEURONTIN) 300 MG capsule TAKE 1 CAPSULE (300 MG TOTAL) BY MOUTH 3 (THREE) TIMES DAILY. Patient not taking: Reported on 07/03/2015 09/03/13   Timmothy Euler, MD  ibuprofen (ADVIL,MOTRIN) 600 MG tablet Take 1 tablet (600 mg total) by mouth every 8 (eight) hours as needed. 07/03/15   Jola Schmidt, MD   BP 113/73 mmHg  Pulse 99  Temp(Src) 98.2 F (36.8 C) (Oral)  Resp 14  Ht 5\' 4"  (1.626 m)  Wt 120 lb (54.432 kg)  BMI 20.59 kg/m2  SpO2 98% Physical Exam  Constitutional: She is oriented to person, place, and time. She appears well-developed and well-nourished. No distress.  HENT:  Head: Normocephalic and atraumatic.  Eyes: EOM are normal. Pupils are equal, round, and reactive to light.  Neck: Normal range of motion.  Cardiovascular: Normal rate, regular rhythm and normal heart sounds.   Pulmonary/Chest: Effort normal and breath sounds normal.  Abdominal: Soft. She exhibits no distension.  There is no tenderness.  Musculoskeletal: Normal range of motion.  Neurological: She is alert and oriented to person, place, and time.  5/5 strength in major muscle groups of  bilateral upper and lower extremities. Speech normal. No facial asymetry.   Skin: Skin is warm and dry.  Psychiatric: She has a normal mood and affect. Judgment normal.  Nursing note and vitals reviewed.   ED Course  Procedures (including critical care time) Labs Review Labs Reviewed - No data to display  Imaging Review No results found.   EKG Interpretation None      MDM   Final diagnoses:  Other type of migraine   Typical migraine headache for the pt. Non focal neuro exam. No recent head trauma.  No fever. Doubt meningitis. Doubt intracranial bleed. Doubt normal pressure hydrocephalus. No indication for imaging. Will treat with migraine cocktail and reevaluate  6:29 AM Migraine resolved    Jola Schmidt, MD 07/03/15 [phone]

## 2015-07-20 ENCOUNTER — Other Ambulatory Visit: Payer: Self-pay | Admitting: Family Medicine

## 2015-07-20 NOTE — Telephone Encounter (Signed)
Last seen 01/20/15  Dr Wendi Snipes

## 2015-10-11 ENCOUNTER — Ambulatory Visit: Payer: Self-pay | Admitting: Internal Medicine

## 2015-11-23 ENCOUNTER — Telehealth: Payer: Self-pay | Admitting: Internal Medicine

## 2015-11-23 NOTE — Telephone Encounter (Signed)
Has had head and chest congestion for about a week. Has Equate Daytime, Nighttime, Alka selzer, Mucinex,  Is concerned because heart rate runs high. Wonders what she can take

## 2015-11-25 NOTE — Telephone Encounter (Signed)
Called patient to provide advice, however was not able to reach her. I think that all of those medications would be fine for the patient to take despite hx of tachycardia ( having reviewed her chart and last cardiology visit).

## 2015-12-20 ENCOUNTER — Encounter (HOSPITAL_COMMUNITY): Payer: Self-pay | Admitting: Emergency Medicine

## 2015-12-20 ENCOUNTER — Emergency Department (HOSPITAL_COMMUNITY)
Admission: EM | Admit: 2015-12-20 | Discharge: 2015-12-20 | Disposition: A | Discharge status: Home / Self Care | Payer: Self-pay | Attending: Emergency Medicine | Admitting: Emergency Medicine

## 2015-12-20 DIAGNOSIS — M25552 Pain in left hip: Secondary | ICD-10-CM | POA: Insufficient documentation

## 2015-12-20 DIAGNOSIS — M62838 Other muscle spasm: Secondary | ICD-10-CM | POA: Insufficient documentation

## 2015-12-20 DIAGNOSIS — M79652 Pain in left thigh: Secondary | ICD-10-CM | POA: Insufficient documentation

## 2015-12-20 DIAGNOSIS — Z880 Allergy status to penicillin: Secondary | ICD-10-CM | POA: Insufficient documentation

## 2015-12-20 DIAGNOSIS — F419 Anxiety disorder, unspecified: Secondary | ICD-10-CM | POA: Insufficient documentation

## 2015-12-20 DIAGNOSIS — Z79899 Other long term (current) drug therapy: Secondary | ICD-10-CM | POA: Insufficient documentation

## 2015-12-20 DIAGNOSIS — M545 Low back pain: Secondary | ICD-10-CM | POA: Insufficient documentation

## 2015-12-20 DIAGNOSIS — F17210 Nicotine dependence, cigarettes, uncomplicated: Secondary | ICD-10-CM | POA: Insufficient documentation

## 2015-12-20 DIAGNOSIS — F3131 Bipolar disorder, current episode depressed, mild: Secondary | ICD-10-CM | POA: Insufficient documentation

## 2015-12-20 HISTORY — DX: Dorsalgia, unspecified: M54.9

## 2015-12-20 MED ORDER — CYCLOBENZAPRINE HCL 10 MG PO TABS
5.0000 mg | ORAL_TABLET | Freq: Once | ORAL | Status: AC
  Administered 2015-12-20: 5 mg via ORAL
  Filled 2015-12-20: qty 1

## 2015-12-20 MED ORDER — OXYCODONE-ACETAMINOPHEN 5-325 MG PO TABS
ORAL_TABLET | ORAL | Status: AC
  Filled 2015-12-20: qty 1

## 2015-12-20 MED ORDER — CYCLOBENZAPRINE HCL 10 MG PO TABS: 10.0000 mg | ORAL_TABLET | Freq: Two times a day (BID) | ORAL | Status: DC | PRN

## 2015-12-20 MED ORDER — OXYCODONE-ACETAMINOPHEN 5-325 MG PO TABS
1.0000 | ORAL_TABLET | Freq: Once | ORAL | Status: AC
  Administered 2015-12-20: 1 via ORAL

## 2015-12-20 MED ORDER — PROMETHAZINE HCL 12.5 MG PO TABS
12.5000 mg | ORAL_TABLET | Freq: Once | ORAL | Status: AC
  Administered 2015-12-20: 12.5 mg via ORAL
  Filled 2015-12-20: qty 1

## 2015-12-20 NOTE — ED Notes (Addendum)
Pt states Percocet got stuck in her throat and was trying to drink more water to get it down.  She then started coughing and gagging and a partial pill was spit into a cup.  Denies any previous history of difficulty swallowing pills.

## 2015-12-20 NOTE — ED Provider Notes (Signed)
CSN: [id number]     Arrival date & time 12/20/15  0156 History   First MD Initiated Contact with Patient 12/20/15 0503     Chief Complaint  Patient presents with  . Back Pain     (Consider location/radiation/quality/duration/timing/severity/associated sxs/prior Treatment) HPI   Patient presents to the emergency department for evaluation of her back and left leg spasms. She is seen her primary care doctor for this multiple times and is treated with Flexeril. She states that the Flexeril usually works pretty well but she is out of her prescription. She works as a Public house manager and while working this afternoon developed the spasms. The quality is severity of the pain is not different than usual flare. Her symptoms originally started years ago after an accident. She has not had any associated urinary or bowel symptoms, no fevers, nausea, vomiting, diarrhea, weakness or fatigue the patient requests a dose of Phenergan and Flexeril.  Apparently during triage patient went to swallow a Percocet tab but it got stuck in her throat, she says with water part of it went down and she was able to vomit the other part back up. She is not having any symptoms related to that and has recovered.  Past Medical History  Diagnosis Date  . Depression   . Bipolar affective ([place])   . Anxiety   . Back pain    Past Surgical History  Procedure Laterality Date  . Neck surgery     No family history on file. Social History  Substance Use Topics  . Smoking status: Current Every Day Smoker -- 0.80 packs/day    Types: Cigarettes  . Smokeless tobacco: None     Comment: would like to love to quitt per pt  . Alcohol Use: Yes   OB History    No data available     Review of Systems  Review of Systems All other systems negative except as documented in the HPI. All pertinent positives and negatives as reviewed in the HPI.   Allergies  Penicillins  Home Medications   Prior to Admission  medications   Medication Sig Start Date End Date Taking? Authorizing Provider  albuterol (PROVENTIL HFA;VENTOLIN HFA) 108 (90 BASE) MCG/ACT inhaler Inhale 2 puffs into the lungs 4 (four) times daily. 09/16/14   Harden Mo, MD  clonazePAM (KLONOPIN) 0.5 MG tablet TAKE 1 TABLET AS NEEDED Patient not taking: Reported on 07/03/2015 06/13/13   Sharon [address], MD  cyclobenzaprine (FLEXERIL) 10 MG tablet TAKE ONE TABLET TWICE DAILY AS NEEDED FOR MUSCLE SPASM Patient not taking: Reported on 07/03/2015 12/11/14   Timmothy Euler, MD  cyclobenzaprine (FLEXERIL) 10 MG tablet Take 1 tablet (10 mg total) by mouth 2 (two) times daily as needed for muscle spasms. 12/20/15   Ericson Nafziger Carlota Raspberry, PA-C  gabapentin (NEURONTIN) 300 MG capsule TAKE 1 CAPSULE (300 MG TOTAL) BY MOUTH 3 (THREE) TIMES DAILY. Patient not taking: Reported on 07/03/2015 09/03/13   Timmothy Euler, MD  ibuprofen (ADVIL,MOTRIN) 600 MG tablet Take 1 tablet (600 mg total) by mouth every 8 (eight) hours as needed. 07/03/15   Jola Schmidt, MD  lithium carbonate 300 MG capsule Take 1 capsule (300 mg total) by mouth 2 (two) times daily with a meal. 12/18/13   Timmothy Euler, MD  mirtazapine (REMERON) 30 MG tablet Take 1 tablet (30 mg total) by mouth at bedtime. 12/18/13   Timmothy Euler, MD  QUEtiapine (SEROQUEL) 300 MG tablet Take 1 tablet (300 mg total) by  mouth 2 (two) times daily. 12/18/13   Timmothy Euler, MD  TRAZODONE HCL PO Take 1 tablet by mouth at bedtime.    Historical Provider, MD   BP 115/74 mmHg  Pulse 101  Temp(Src) 97.7 F (36.5 C) (Oral)  Resp 18  Wt 54.432 kg  SpO2 98% Physical Exam  Constitutional: She appears well-developed and well-nourished. No distress.  HENT:  Head: Normocephalic and atraumatic.  Right Ear: Tympanic membrane and ear canal normal.  Left Ear: Tympanic membrane and ear canal normal.  Nose: Nose normal.  Mouth/Throat: Uvula is midline, oropharynx is clear and moist and mucous membranes are  normal.  Eyes: Pupils are equal, round, and reactive to light.  Neck: Normal range of motion. Neck supple.  Cardiovascular: Normal rate and regular rhythm.   Pulmonary/Chest: Effort normal.  Abdominal: Soft.  No signs of abdominal distention  Musculoskeletal:       Left hip: She exhibits tenderness. She exhibits normal range of motion, normal strength and no bony tenderness.       Lumbar back: She exhibits tenderness, pain and spasm. She exhibits normal range of motion and no bony tenderness.       Left upper leg: She exhibits tenderness. She exhibits no bony tenderness and no swelling.  Patient is flinching in pain and reports actively having intermittent spasms during physical exam, I am unable to appreciate the spasms to low back, hamstring, quadriceps on the left.  She has symmetrical and physiologic bilateral pedal pulses, intact sensations and strength.  Neurological: She is alert.  Acting at baseline  Skin: Skin is warm and dry. No rash noted.  Nursing note and vitals reviewed.   ED Course  Procedures (including critical care time) Labs Review Labs Reviewed - No data to display  Imaging Review No results found. I have personally reviewed and evaluated these images and lab results as part of my medical decision-making.   EKG Interpretation None      MDM   Final diagnoses:  Muscle spasm of left lower extremity    I have reviewed her primary care doctor's notes. Per her primary care doctor she has been dealing with spasms for a while, the etiology is unclear, the symptoms are atypical. They have started treating her with gabapentin which has decreased the flares.  Rx Flexeril patient needs to follow up with the primary care doctor  Delos Haring, PA-C 12/20/15 [id number]  April Palumbo, MD 12/20/15 [phone]

## 2015-12-20 NOTE — ED Notes (Signed)
C/o flare-up of thoracic back pain/spasms x 2 days that radiates down L leg. No known recent injury.

## 2015-12-20 NOTE — Discharge Instructions (Signed)

## 2016-04-28 ENCOUNTER — Other Ambulatory Visit: Payer: Self-pay | Admitting: Internal Medicine

## 2016-04-28 MED ORDER — CYCLOBENZAPRINE HCL 10 MG PO TABS: 10.0000 mg | ORAL_TABLET | Freq: Two times a day (BID) | ORAL | Status: DC

## 2016-04-28 MED ORDER — ALBUTEROL SULFATE HFA 108 (90 BASE) MCG/ACT IN AERS: 2.0000 | INHALATION_SPRAY | Freq: Four times a day (QID) | RESPIRATORY_TRACT | Status: DC

## 2016-04-28 NOTE — Telephone Encounter (Signed)
Refill request for Flexeril and albuterol. Please send refill to [facility] on [address].

## 2016-05-15 ENCOUNTER — Encounter: Payer: Self-pay | Admitting: Family Medicine

## 2016-05-15 ENCOUNTER — Ambulatory Visit (INDEPENDENT_AMBULATORY_CARE_PROVIDER_SITE_OTHER): Payer: Self-pay | Admitting: Family Medicine

## 2016-05-15 VITALS — BP 118/75 | HR 107 | Temp 98.5°F | Ht 64.0 in | Wt 108.4 lb

## 2016-05-15 DIAGNOSIS — Z716 Tobacco abuse counseling: Secondary | ICD-10-CM

## 2016-05-15 DIAGNOSIS — J309 Allergic rhinitis, unspecified: Secondary | ICD-10-CM

## 2016-05-15 DIAGNOSIS — M62838 Other muscle spasm: Secondary | ICD-10-CM | POA: Insufficient documentation

## 2016-05-15 DIAGNOSIS — J069 Acute upper respiratory infection, unspecified: Secondary | ICD-10-CM

## 2016-05-15 DIAGNOSIS — R000 Tachycardia, unspecified: Secondary | ICD-10-CM

## 2016-05-15 HISTORY — DX: Other muscle spasm: M62.838

## 2016-05-15 LAB — CBC
HCT: 42 % (ref 35.0–45.0)
HEMOGLOBIN: 13.9 g/dL (ref 11.7–15.5)
MCH: 27.8 pg (ref 27.0–33.0)
MCHC: 33.1 g/dL (ref 32.0–36.0)
MCV: 84 fL (ref 80.0–100.0)
MPV: 9.7 fL (ref 7.5–12.5)
Platelets: 402 10*3/uL — ABNORMAL HIGH (ref 140–400)
RBC: 5 MIL/uL (ref 3.80–5.10)
RDW: 15.5 % — ABNORMAL HIGH (ref 11.0–15.0)
WBC: 19.3 10*3/uL — ABNORMAL HIGH (ref 3.8–10.8)

## 2016-05-15 MED ORDER — PROMETHAZINE HCL 12.5 MG PO TABS: 12.5000 mg | ORAL_TABLET | Freq: Three times a day (TID) | ORAL | Status: DC | PRN

## 2016-05-15 MED ORDER — FLUTICASONE PROPIONATE 50 MCG/ACT NA SUSP: 2.0000 | Freq: Every day | NASAL | Status: DC

## 2016-05-15 NOTE — Patient Instructions (Signed)
I think you have significant post nasa drip related to allergic rhinitis, or allergies. We teat this with flonase (an intranasal steroid) and nasal saline. You may also try honey 1 tbsp every few hours to help protect your throat.   We are getting some labs and we'll notify you of those results. These will be a complete blood count and a complete metabolic panel.   What to do:  - Drink adequate fluids.  - Use nasal saline spray as needed to help clear runny nose. - Take warm showers and drink hot tea. The humid air will help with runny nose and congestion. - Take 1 tbsp of honey every 2 hours to help with cough and sore throat. - Minimize your exposure to smoke from cigarettes, etc. - You may also take phenergan as needed for severe nausea. This and flonase have been sent to your pharmacy.

## 2016-05-15 NOTE — Progress Notes (Signed)
Subjective: [name] is a 50 y.o. female presenting for muscle spasms and congestion.   Left leg muscle spasms have returned, significant when present, comes and goes, at posterior thigh, worst at work when she's sitting for 8 hours at a time, not worse when sleeping. Has had this for years but no work up that she knows of, but admits to memory loss related to trauma.   She complains of constant, unchanged, significant congestion x 2 months, yellow green mucous and has a nonproductive cough. Alka selzter, thraflu, mucinex, generic cold symptom relief and other OTC medications have provided minimal relief. Nasal sprays not tried. No sick contacts.   - ROS: No fevers, trouble breathing, chest pain, wheezing, palpitations, leg swelling, orthopnea.  - Denies history of allergies - Smoker, recently less than usual. Not currently interested in stopping.   Objective: BP 118/75 mmHg  Pulse 107  Temp(Src) 98.5 F (36.9 C) (Oral)  Ht 5\' 4"  (1.626 m)  Wt 108 lb 6.4 oz (49.17 kg)  BMI 18.60 kg/m2 Gen: Thin well-appearing 50 y.o. female in no distress HEENT: Normocephalic, sclerae clear, conjunctivae normal, pupils equal and reactive, left lateral strabismus with decreased left vision, nares injected with boggy turbinates, moist mucous membranes, posterior oropharynx erythematous with cobblestoning, poor dentition CV: Regular rate, no murmur; radial, DP and PT pulses 2+ bilaterally; no LE edema, no JVD, cap refill < 2 sec. Pulm: Non-labored breathing ambient air; CTAB, no wheezes or crackles GI: Normoactive-BS; soft, non-tender, non-distended, no organomegaly, no hernia appreciated MSK: Normal gait and station; no digital clubbing/cyanosis, no frank joint deformity/effusion, full active ROM, no paraspinal spasm, no midline tenderness in spine  Assessment/Plan: [name] is a 50 y.o. female here for muscle spasms and allergies.  See problem list.   Chronically tachycardic, last TSH wnl.

## 2016-05-15 NOTE — Assessment & Plan Note (Signed)
Typical symptoms without red flags x 2 months.  - Nasal saline - Flonase - Consider po antihistamine if not improving.

## 2016-05-15 NOTE — Assessment & Plan Note (Signed)
Discussed contribution of this to her cough which seems to be becoming chronic. She remains precontemplative.

## 2016-05-15 NOTE — Assessment & Plan Note (Signed)
Chronic, recurrent, severe. Will do preliminary blood work, as she's not had labs in years. R/o anemia and electrolyte imbalance. Urged to stay hydrated and continue flexeril which she has at home.

## 2016-05-16 LAB — COMPLETE METABOLIC PANEL WITH GFR
ALBUMIN: 4.7 g/dL (ref 3.6–5.1)
ALK PHOS: 148 U/L — AB (ref 33–130)
ALT: 11 U/L (ref 6–29)
AST: 13 U/L (ref 10–35)
BUN: 5 mg/dL — AB (ref 7–25)
CALCIUM: 10.2 mg/dL (ref 8.6–10.4)
CO2: 22 mmol/L (ref 20–31)
Chloride: 102 mmol/L (ref 98–110)
Creat: 0.8 mg/dL (ref 0.50–1.05)
GFR, Est Non African American: 86 mL/min (ref >=60)
Glucose, Bld: 104 mg/dL — ABNORMAL HIGH (ref 65–99)
Potassium: 4.2 mmol/L (ref 3.5–5.3)
Sodium: 135 mmol/L (ref 135–146)
Total Bilirubin: 0.3 mg/dL (ref 0.2–1.2)
Total Protein: 7.3 g/dL (ref 6.1–8.1)

## 2017-05-14 ENCOUNTER — Telehealth: Payer: Self-pay | Admitting: Internal Medicine

## 2017-05-14 NOTE — Telephone Encounter (Signed)
Pt has UTI. She would like to know what she can buy over the counter. Please advise

## 2017-05-15 NOTE — Telephone Encounter (Signed)
Spoke with patient, advised she could try OTC Azo for any discomfort but she would likely need to be seen and prescribed antibiotics if it is a true UTI. Appointment scheduled for tomorrow morning.

## 2017-05-16 ENCOUNTER — Ambulatory Visit: Payer: Self-pay | Admitting: Internal Medicine

## 2017-05-17 ENCOUNTER — Ambulatory Visit: Payer: Self-pay | Admitting: Family Medicine

## 2017-05-17 NOTE — Progress Notes (Deleted)
   Subjective:   [name] is a 51 y.o. female with a history of *** here for same day appt for No chief complaint on file.    *** DYSURIA  Pain urinating started *** days ago. Pain is: *** Medications tried: *** Any antibiotics in the last 30 days: *** More than 3 UTIs in the last 12 months: *** STD exposure: *** Possibly pregnant: ***  Symptoms Urgency: *** Frequency: *** Blood in urine: *** Pain in back:*** Fever: *** Vaginal discharge: *** Mouth Ulcers: ***  Review of Symptoms - see HPI PMH - Smoking status noted.     Objective:  There were no vitals taken for this visit.  Gen:  51 y.o. female in NAD *** HEENT: NCAT, MMM, EOMI, PERRL, anicteric sclerae CV: RRR, no MRG, no JVD Resp: Non-labored, CTAB, no wheezes noted Abd: Soft, NTND, BS present, no guarding or organomegaly Ext: WWP, no edema MSK: Full ROM, strength intact Neuro: Alert and oriented, speech normal       Chemistry      Component Value Date/Time   NA 135 05/15/2016 1427   K 4.2 05/15/2016 1427   CL 102 05/15/2016 1427   CO2 22 05/15/2016 1427   BUN 5 (L) 05/15/2016 1427   CREATININE 0.80 05/15/2016 1427      Component Value Date/Time   CALCIUM 10.2 05/15/2016 1427   ALKPHOS 148 (H) 05/15/2016 1427   AST 13 05/15/2016 1427   ALT 11 05/15/2016 1427   BILITOT 0.3 05/15/2016 1427      Lab Results  Component Value Date   WBC 19.3 (H) 05/15/2016   HGB 13.9 05/15/2016   HCT 42.0 05/15/2016   MCV 84.0 05/15/2016   PLT 402 (H) 05/15/2016   Lab Results  Component Value Date   TSH 4.191 01/13/2015   Lab Results  Component Value Date   HGBA1C 6.2 03/11/2009   Assessment & Plan:     [name] is a 51 y.o. female here for ***  No problem-specific Assessment & Plan notes found for this encounter.     Virginia Crews, MD MPH PGY-3,  [facility] 05/17/2017  1:51 PM

## 2017-05-21 NOTE — Progress Notes (Deleted)
   Subjective:   Patient ID: [name]    DOB: 1966/10/29, 51 y.o. female   MRN: [id number]  CC: "***"  HPI: [name] is a 51 y.o. female who presents to clinic today ***. Problems discussed today are as follows:  ***: *** ROS: ***  ***Patient called asking for OTC Azo for discomfort. Concern for UTI. Has history of pansensitive Escherichia coli by urine culture 09/2013 and prior to that 08/2013.  Complete ROS performed, see HPI for pertinent.  [place]: LVH, migraines, GERD, bipolar disorder, tobacco use disorder, AUB. H/o neck surgery. Father aand mother w/o medical history. Smoking status reviewed. Medications reviewed.  Objective:   There were no vitals taken for this visit. Vitals and nursing note reviewed.  General: well nourished, well developed, in no acute distress with non-toxic appearance HEENT: normocephalic, atraumatic, moist mucous membranes Neck: supple, non-tender without lymphadenopathy CV: regular rate and rhythm without murmurs, rubs, or gallops, no lower extremity edema Lungs: clear to auscultation bilaterally with normal work of breathing Abdomen: soft, non-tender, non-distended, no masses or organomegaly palpable, normoactive bowel sounds Skin: warm, dry, no rashes or lesions, cap refill < 2 seconds Extremities: warm and well perfused, normal tone  Assessment & Plan:   No problem-specific Assessment & Plan notes found for this encounter.  No orders of the defined types were placed in this encounter.  No orders of the defined types were placed in this encounter.   Harriet [place], [place], PGY-1 05/21/2017 12:04 PM

## 2017-05-22 ENCOUNTER — Ambulatory Visit: Payer: Self-pay | Admitting: Family Medicine

## 2018-01-29 ENCOUNTER — Ambulatory Visit (INDEPENDENT_AMBULATORY_CARE_PROVIDER_SITE_OTHER): Payer: Self-pay | Admitting: Internal Medicine

## 2018-01-29 VITALS — BP 100/60 | HR 99 | Temp 98.2°F | Wt 105.2 lb

## 2018-01-29 DIAGNOSIS — R399 Unspecified symptoms and signs involving the genitourinary system: Secondary | ICD-10-CM

## 2018-01-29 DIAGNOSIS — Z23 Encounter for immunization: Secondary | ICD-10-CM

## 2018-01-29 LAB — POCT UA - MICROSCOPIC ONLY

## 2018-01-29 LAB — POCT URINALYSIS DIP (MANUAL ENTRY)
BILIRUBIN UA: NEGATIVE
GLUCOSE UA: NEGATIVE mg/dL
Ketones, POC UA: NEGATIVE mg/dL
Leukocytes, UA: NEGATIVE
NITRITE UA: POSITIVE — AB
Protein Ur, POC: 30 mg/dL — AB
Spec Grav, UA: 1.025 (ref 1.010–1.025)
Urobilinogen, UA: 0.2 E.U./dL
pH, UA: 6 (ref 5.0–8.0)

## 2018-01-29 MED ORDER — SULFAMETHOXAZOLE-TRIMETHOPRIM 800-160 MG PO TABS: 1.0000 | ORAL_TABLET | Freq: Two times a day (BID) | ORAL | 0 refills | Status: DC

## 2018-01-29 NOTE — Progress Notes (Signed)
   [name] Kerrin Mo, MD Phone: [phone]  Reason For Visit: SDA for increase urine urgency   #UTI symptoms   Pain urinating started 3 months. Notes have a smell to her urine Has had increased frequency and urgency to urinate. Denies dysuria.Patient denies seeing anyone for this issue re Any antibiotics in the last 30 days: none  More than 3 UTIs in the last 12 months: none  STD exposure: Does have a recent new partner, does not use condoms   Symptoms Urgency: Yes Frequency: Yes  blood in urine: No Pain in back: No Fever: No Vaginal discharge: No   Review of Symptoms - see HPI PMH - Smoking status noted.    Objective: BP 100/60 (BP Location: Right Arm, Patient Position: Sitting, Cuff Size: Small)   Pulse 99   Temp 98.2 F (36.8 C) (Oral)   Wt 105 lb 3.2 oz (47.7 kg)   SpO2 99%   BMI 18.06 kg/m  Gen: NAD, alert, cooperative with exam Cardio: regular rate and rhythm, S1S2 heard, no murmurs appreciated GI: soft, non-tender, non-distended, bowel sounds present, no hepatomegaly, no splenomegaly, no CVA tenderness  Skin: dry, intact, no rashes or lesions    Assessment/Plan: See problem based a/p  UTI symptoms Likely with UTI.  Similar to previous history of UTI per patient. no systemic symptoms.  The only other possible concern is that patient does have a recent new sexual partner and therefore could have urethritis, she denies any current concerns for this  - Bactrim BID for 3 days  - POCT urinalysis dipstick - Urine Culture - Follow up if no improvement or negative urine culture for possibly urethritis

## 2018-01-29 NOTE — Patient Instructions (Signed)
Please take Bactrim twice daily for 3 days . If your symptoms do not improve return for follow up

## 2018-01-29 NOTE — Assessment & Plan Note (Signed)
Likely with UTI.  Similar to previous history of UTI per patient. no systemic symptoms.  The only other possible concern is that patient does have a recent new sexual partner and therefore could have urethritis, she denies any current concerns for this  - Bactrim BID for 3 days  - POCT urinalysis dipstick - Urine Culture - Follow up if no improvement or negative urine culture for possibly urethritis

## 2018-01-31 LAB — URINE CULTURE

## 2018-04-30 ENCOUNTER — Ambulatory Visit: Payer: Self-pay | Admitting: Internal Medicine

## 2018-05-02 ENCOUNTER — Encounter: Payer: Self-pay | Admitting: Internal Medicine

## 2018-05-02 ENCOUNTER — Ambulatory Visit (INDEPENDENT_AMBULATORY_CARE_PROVIDER_SITE_OTHER): Payer: Self-pay | Admitting: Internal Medicine

## 2018-05-02 VITALS — HR 90 | Temp 98.8°F | Wt 106.4 lb

## 2018-05-02 DIAGNOSIS — G43909 Migraine, unspecified, not intractable, without status migrainosus: Secondary | ICD-10-CM

## 2018-05-02 DIAGNOSIS — R399 Unspecified symptoms and signs involving the genitourinary system: Secondary | ICD-10-CM

## 2018-05-02 LAB — POCT URINALYSIS DIP (MANUAL ENTRY)
BILIRUBIN UA: NEGATIVE mg/dL
Bilirubin, UA: NEGATIVE
Glucose, UA: NEGATIVE mg/dL
Nitrite, UA: POSITIVE — AB
Protein Ur, POC: NEGATIVE mg/dL
SPEC GRAV UA: 1.02 (ref 1.010–1.025)
UROBILINOGEN UA: 1 U/dL
pH, UA: 6.5 (ref 5.0–8.0)

## 2018-05-02 LAB — POCT UA - MICROSCOPIC ONLY

## 2018-05-02 MED ORDER — PROPRANOLOL HCL 80 MG PO TABS: 80.0000 mg | ORAL_TABLET | Freq: Three times a day (TID) | ORAL | 0 refills | Status: DC

## 2018-05-02 NOTE — Assessment & Plan Note (Signed)
Will check for urinary tract infection Follow-up with patient to determine if she needs treatment or not

## 2018-05-02 NOTE — Assessment & Plan Note (Addendum)
History of migraines, more recently this past month.  Apparently seen in the past by neurology -without much successful control of these headaches.  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 Notes having a small palpable rubber lesion - possibly lymph node vs. Lipoma - has been present for 4-5 years not growing.  -Will try propranolol - Will recheck CBC and BMET  - last noted WBC of 19 in 2017 -Follow up in two - three weeks

## 2018-05-02 NOTE — Progress Notes (Signed)
   [name] Emily Mo, MD Phone: [phone]  Reason For Visit:   # UTI Patient presenting with concern for smell to her urine about 3 weeks ago. No dysuria, no increased frequency, no discharge, no fever, no nausea, no pelvic pain.  Has not been sexual active in 3 months. Patient states last time treated, helped. No concern about STD exposure   # Migraines  Patient states she has had migraines for about 20 years. Start in the back of the head and comes to front. Patient states for the past month she has been getting migraines every other day. Patient states they last for a couple of hours. Positive for photophobia or phonophobia. The sun does seem to cause her to get migraines. Patient states these migraines are not as intense as previous, but they come more. Patient is taking excederin. Patient states a long time ago she was seen by neurology and was tried on mutliple medications that did not work. No new changes or patterns. Approximatly 23 pack years .   Past Medical History Reviewed problem list.  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  Apparently seen in the past by neurology -without much successful control of these headaches.  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 Notes having a small palpable rubber lesion - possibly lymph node vs. Lipoma - has been present for 4-5 years not growing.  -Will try propranolol - Will recheck CBC and BMET  - last noted WBC of 19 in 2017 -Follow up in two - three weeks

## 2018-05-02 NOTE — Patient Instructions (Addendum)
We are going to see if propanolol helps with your headaches.  You will take this 3 times a day.  For the next 2 to 3 weeks.  If you do not have any improvement please return for follow-up.  We will check your urine today to make sure there is no signs of infection if there is we will call you and let you know.  Make an appointment in the next couple of weeks to get your Pap smear done, also need to have a mammogram and a colonoscopy at some point.

## 2018-05-03 ENCOUNTER — Telehealth: Payer: Self-pay | Admitting: Internal Medicine

## 2018-05-03 DIAGNOSIS — R399 Unspecified symptoms and signs involving the genitourinary system: Secondary | ICD-10-CM

## 2018-05-03 LAB — BASIC METABOLIC PANEL
BUN/Creatinine Ratio: 10 (ref 9–23)
BUN: 8 mg/dL (ref 6–24)
CALCIUM: 9.8 mg/dL (ref 8.7–10.2)
CHLORIDE: 105 mmol/L (ref 96–106)
CO2: 22 mmol/L (ref 20–29)
Creatinine, Ser: 0.84 mg/dL (ref 0.57–1.00)
GFR calc non Af Amer: 80 mL/min/{1.73_m2} (ref >59)
GFR, EST AFRICAN AMERICAN: 92 mL/min/{1.73_m2} (ref >59)
GLUCOSE: 86 mg/dL (ref 65–99)
Potassium: 4.6 mmol/L (ref 3.5–5.2)
Sodium: 142 mmol/L (ref 134–144)

## 2018-05-03 LAB — CBC WITH DIFFERENTIAL/PLATELET
BASOS: 0 %
Basophils Absolute: 0 10*3/uL (ref 0.0–0.2)
EOS (ABSOLUTE): 0.1 10*3/uL (ref 0.0–0.4)
Eos: 1 %
HEMOGLOBIN: 13 g/dL (ref 11.1–15.9)
Hematocrit: 40.5 % (ref 34.0–46.6)
IMMATURE GRANS (ABS): 0 10*3/uL (ref 0.0–0.1)
IMMATURE GRANULOCYTES: 0 %
LYMPHS: 32 %
Lymphocytes Absolute: 3.3 10*3/uL — ABNORMAL HIGH (ref 0.7–3.1)
MCH: 26 pg — ABNORMAL LOW (ref 26.6–33.0)
MCHC: 32.1 g/dL (ref 31.5–35.7)
MCV: 81 fL (ref 79–97)
MONOCYTES: 6 %
Monocytes Absolute: 0.6 10*3/uL (ref 0.1–0.9)
NEUTROS PCT: 61 %
Neutrophils Absolute: 6.2 10*3/uL (ref 1.4–7.0)
PLATELETS: 319 10*3/uL (ref 150–450)
RBC: 5 x10E6/uL (ref 3.77–5.28)
RDW: 14.5 % (ref 12.3–15.4)
WBC: 10.2 10*3/uL (ref 3.4–10.8)

## 2018-05-03 MED ORDER — SULFAMETHOXAZOLE-TRIMETHOPRIM 800-160 MG PO TABS: 1.0000 | ORAL_TABLET | Freq: Two times a day (BID) | ORAL | 0 refills | Status: DC

## 2018-05-03 NOTE — Telephone Encounter (Signed)
Entered in error

## 2018-05-03 NOTE — Telephone Encounter (Signed)
Called patient with positive nitrites in urine. Will treat with Bactrim.

## 2018-05-04 LAB — URINE CULTURE

## 2018-08-30 ENCOUNTER — Other Ambulatory Visit: Payer: Self-pay

## 2018-08-30 ENCOUNTER — Ambulatory Visit (INDEPENDENT_AMBULATORY_CARE_PROVIDER_SITE_OTHER): Payer: Self-pay | Admitting: Family Medicine

## 2018-08-30 ENCOUNTER — Encounter: Payer: Self-pay | Admitting: Family Medicine

## 2018-08-30 DIAGNOSIS — J4520 Mild intermittent asthma, uncomplicated: Secondary | ICD-10-CM

## 2018-08-30 DIAGNOSIS — J45909 Unspecified asthma, uncomplicated: Secondary | ICD-10-CM | POA: Insufficient documentation

## 2018-08-30 MED ORDER — CETIRIZINE HCL 10 MG PO TABS: 10.0000 mg | ORAL_TABLET | Freq: Every day | ORAL | 3 refills | Status: DC

## 2018-08-30 MED ORDER — FLUTICASONE PROPIONATE 50 MCG/ACT NA SUSP: 2.0000 | Freq: Every day | NASAL | 6 refills | Status: DC

## 2018-08-30 MED ORDER — CYCLOBENZAPRINE HCL 10 MG PO TABS: 10.0000 mg | ORAL_TABLET | Freq: Two times a day (BID) | ORAL | 0 refills | Status: DC | PRN

## 2018-08-30 MED ORDER — ALBUTEROL SULFATE HFA 108 (90 BASE) MCG/ACT IN AERS: 2.0000 | INHALATION_SPRAY | Freq: Four times a day (QID) | RESPIRATORY_TRACT | 0 refills | Status: DC

## 2018-08-30 NOTE — Patient Instructions (Signed)
It was great to see you!  Our plans for today:  - I am refilling your albuterol inhaler. I want you to take this 2 puffs every 4 hours for the next 24 hours. After that, you can resume on an as needed basis. - I am also sending in zyrtec, this should help control your allergies. You will take this every day. - If you are still having difficulties after a few days, come back to see [place]. - If you develop severe difficulty breathing, go to the ED.  Take care and seek immediate care sooner if you develop any concerns.   Dr. [facility]

## 2018-08-30 NOTE — Assessment & Plan Note (Signed)
Symptoms consistent with asthma flare triggered by environmental allergies given rhinorrhea and nighttime cough. No congestion or relief with mucinex to suggest URI. Refilled albuterol with instructions to take every 4 hours for the next 24 hours then to resume on an as needed basis. Also refilled flonase and prescribed daily zyrtec. Strict return and emergency precautions reviewed.

## 2018-08-30 NOTE — Progress Notes (Signed)
  Subjective:   Patient ID: [name]    DOB: 1966/02/21, 52 y.o. female   MRN: [id number]  [name] is a 52 y.o. female with a history of asthma here for   COUGH  Has been coughing for about a month. Coughs more at night. Cough is: productive Sputum production: dark green Medications tried: mucinex, cough syrup doesn't help, takes sinus medication OTC. Has not used her albuterol inhaler as she has been out for months. Has not required daily inhaler for asthma. Taking blood pressure medications: N/A  Symptoms Runny nose: no Mucous in back of throat: yes Throat burning or reflux: no Wheezing or asthma: yes Fever: no Chest Pain: no Shortness of breath: yes, sometimes tight.  Leg swelling: no Hemoptysis: no Weight loss: no  Review of Systems:  Per HPI.  [place], medications and smoking status reviewed.  Objective:   BP 112/64   Pulse 100   Temp 98.5 F (36.9 C) (Oral)   Ht 5\' 4"  (1.626 m)   Wt 105 lb (47.6 kg)   SpO2 100%   BMI 18.02 kg/m  Vitals and nursing note reviewed.  General: thin female, in no acute distress with non-toxic appearance HEENT: normocephalic, atraumatic, moist mucous membranes CV: regular rate and rhythm without murmurs, rubs, or gallops, no lower extremity edema Lungs: normal work of breathing, appropriate saturation on room air Skin: warm, dry, no rashes or lesions Extremities: warm and well perfused, normal tone MSK: ROM grossly intact, strength intact, gait normal Neuro: Alert and oriented, speech normal  Assessment & Plan:   Asthma Symptoms consistent with asthma flare triggered by environmental allergies given rhinorrhea and nighttime cough. No congestion or relief with mucinex to suggest URI. Refilled albuterol with instructions to take every 4 hours for the next 24 hours then to resume on an as needed basis. Also refilled flonase and prescribed daily zyrtec. Strict return and emergency precautions reviewed.  No orders of the defined  types were placed in this encounter.  Meds ordered this encounter  Medications  . albuterol (PROVENTIL HFA;VENTOLIN HFA) 108 (90 Base) MCG/ACT inhaler    Sig: Inhale 2 puffs into the lungs 4 (four) times daily.    Dispense:  1 Inhaler    Refill:  0  . fluticasone (FLONASE) 50 MCG/ACT nasal spray    Sig: Place 2 sprays into both nostrils daily.    Dispense:  16 g    Refill:  6  . cyclobenzaprine (FLEXERIL) 10 MG tablet    Sig: Take 1 tablet (10 mg total) by mouth 2 (two) times daily as needed for muscle spasms.    Dispense:  20 tablet    Refill:  0  . cetirizine (ZYRTEC) 10 MG tablet    Sig: Take 1 tablet (10 mg total) by mouth daily.    Dispense:  90 tablet    Refill:  [place], DO PGY-2, [facility] [facility] 08/30/2018 4:43 PM

## 2018-10-10 ENCOUNTER — Other Ambulatory Visit: Payer: Self-pay | Admitting: Family Medicine

## 2020-01-12 ENCOUNTER — Ambulatory Visit: Payer: Self-pay

## 2020-01-20 ENCOUNTER — Ambulatory Visit (INDEPENDENT_AMBULATORY_CARE_PROVIDER_SITE_OTHER): Payer: Self-pay | Admitting: Family Medicine

## 2020-01-20 ENCOUNTER — Other Ambulatory Visit: Payer: Self-pay

## 2020-01-20 VITALS — BP 110/58 | HR 86 | Temp 98.4°F | Wt 119.0 lb

## 2020-01-20 DIAGNOSIS — M62838 Other muscle spasm: Secondary | ICD-10-CM

## 2020-01-20 MED ORDER — CYCLOBENZAPRINE HCL 10 MG PO TABS: 10.0000 mg | ORAL_TABLET | Freq: Two times a day (BID) | ORAL | 0 refills | Status: DC | PRN

## 2020-01-20 NOTE — Patient Instructions (Addendum)
Please complete Hamstring curls with the band - 3 sets of 10 every day. This will help strengthen your hamstrings allowing them to cramp less. After you complete the curls, stretch out your hamstrings.     Please take a Vitamin B and Vitamin E complex. You can find this over the counter.  Take care, Dr. Tarry Kos
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
## 2020-01-27 ENCOUNTER — Ambulatory Visit: Payer: Self-pay | Admitting: Family Medicine

## 2020-02-09 ENCOUNTER — Telehealth: Payer: Self-pay

## 2020-02-09 NOTE — Telephone Encounter (Signed)
Patient calling nurse line regarding no improvement in muscle spasms. Patient was seen in office on 01/20/20 for muscle spasms and was prescribed flexeril 10 mg. Patient reports little improvement   To PCP and treating provider   Please advise next steps  Talbot Grumbling, RN

## 2020-02-12 ENCOUNTER — Other Ambulatory Visit: Payer: Self-pay | Admitting: Family Medicine

## 2020-02-12 DIAGNOSIS — M62838 Other muscle spasm: Secondary | ICD-10-CM

## 2020-02-12 MED ORDER — BACLOFEN 10 MG PO TABS: 10.0000 mg | ORAL_TABLET | Freq: Three times a day (TID) | ORAL | 0 refills | Status: DC | PRN

## 2020-02-12 NOTE — Telephone Encounter (Signed)
Called patient to discuss her muscle spasms.  Flexeril not helping this time, impacting with her work.  Without insurance, not interested in PT at this time but will consider in the future.  Will transition to baclofen to see if this is of benefit.  May continue to use Tylenol/ibuprofen, topical therapies, staying well-hydrated, heat, and massage.  Recommended lumbar spine and hamstring stretches via youtube.   If not improving, will need to follow-up in the clinic.  Patriciaann Clan, DO

## 2020-03-19 ENCOUNTER — Other Ambulatory Visit: Payer: Self-pay | Admitting: Family Medicine

## 2020-03-19 DIAGNOSIS — M62838 Other muscle spasm: Secondary | ICD-10-CM

## 2020-05-31 ENCOUNTER — Other Ambulatory Visit: Payer: Self-pay | Admitting: Family Medicine

## 2020-05-31 DIAGNOSIS — M62838 Other muscle spasm: Secondary | ICD-10-CM

## 2020-07-06 ENCOUNTER — Other Ambulatory Visit: Payer: Self-pay

## 2020-07-06 ENCOUNTER — Telehealth (HOSPITAL_COMMUNITY): Payer: Medicaid Other | Admitting: Psychiatric/Mental Health

## 2020-07-06 DIAGNOSIS — F329 Major depressive disorder, single episode, unspecified: Secondary | ICD-10-CM | POA: Insufficient documentation

## 2020-07-06 DIAGNOSIS — F411 Generalized anxiety disorder: Secondary | ICD-10-CM | POA: Insufficient documentation

## 2020-07-20 ENCOUNTER — Ambulatory Visit: Payer: Self-pay | Admitting: Family Medicine

## 2020-07-20 NOTE — Progress Notes (Deleted)
    SUBJECTIVE:   CHIEF COMPLAINT / HPI:   ***  PERTINENT  PMH / PSH: ***  OBJECTIVE:   There were no vitals taken for this visit.  ***  ASSESSMENT/PLAN:   No problem-specific Assessment & Plan notes found for this encounter.     Patriciaann Clan, [place]

## 2020-07-26 ENCOUNTER — Other Ambulatory Visit: Payer: Self-pay

## 2020-07-26 DIAGNOSIS — M62838 Other muscle spasm: Secondary | ICD-10-CM

## 2020-07-26 MED ORDER — BACLOFEN 10 MG PO TABS: ORAL_TABLET | ORAL | 0 refills | Status: DC

## 2020-07-27 ENCOUNTER — Ambulatory Visit (INDEPENDENT_AMBULATORY_CARE_PROVIDER_SITE_OTHER): Payer: Medicaid Other | Admitting: Family Medicine

## 2020-07-27 ENCOUNTER — Other Ambulatory Visit: Payer: Self-pay

## 2020-07-27 ENCOUNTER — Encounter: Payer: Self-pay | Admitting: Family Medicine

## 2020-07-27 VITALS — BP 110/58 | HR 110 | Wt 118.0 lb

## 2020-07-27 DIAGNOSIS — R000 Tachycardia, unspecified: Secondary | ICD-10-CM | POA: Diagnosis not present

## 2020-07-27 DIAGNOSIS — R202 Paresthesia of skin: Secondary | ICD-10-CM | POA: Diagnosis present

## 2020-07-27 DIAGNOSIS — G5602 Carpal tunnel syndrome, left upper limb: Secondary | ICD-10-CM

## 2020-07-27 DIAGNOSIS — F3341 Major depressive disorder, recurrent, in partial remission: Secondary | ICD-10-CM | POA: Diagnosis not present

## 2020-07-27 DIAGNOSIS — S6992XA Unspecified injury of left wrist, hand and finger(s), initial encounter: Secondary | ICD-10-CM | POA: Diagnosis not present

## 2020-07-27 MED ORDER — WRIST SPLINT/COCK-UP/LEFT SM MISC: 0 refills | Status: AC

## 2020-07-27 NOTE — Progress Notes (Signed)
SUBJECTIVE:   CHIEF COMPLAINT / HPI: Thumb pain/numbness hands and feet  [name] is a 54 year old female presenting for evaluation of the following:  Thumb pain/clunking: Present for the past 2 weeks.  Left thumb including palmar surface is painful with needlelike sensation.  Worse since onset, reports a clunk and locking of her IP joint every time she tries to bend it and this is particularly bothersome to her.  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  Has tried Voltaren, BenGay, and Excedrin with little relief.  Rubbing region helps a little bit.  No associated weakness or waking her up from sleep.  No locking or clicking of other fingers and has not had this before.  Numbness hands/feet: Bilateral whole foot and hands that extends into her arms a little bit. Present for the past 1-2 months.  Notices this mainly when she is laying down.  No preceding injury or trauma for this or the above concern.  No associated weakness.  Depression/bipolar: PHQ-9 score 23 today with "several days" answer to question #9.  On Seroquel and Zoloft through [facility] and follows up every 3 months.  No plan currently or previously, has thoughts she would just be better off if she did not wake up.  Reports that her daughter is her main support and protective factor.  PERTINENT  PMH / PSH: Migraines, depression/anxiety, bipolar disorder, asthma, tobacco use  OBJECTIVE:   BP (!) 110/58   Pulse (!) 110   Wt 118 lb (53.5 kg)   SpO2 98%   BMI 20.25 kg/m   General: Alert, NAD Cardiac: RRR Lungs: No increased WOB Psych: Depressed mood and affect, gauges in conversation well   Thumb: Inspection of the left thumb without any evidence of erythema, rash, bruising, or deformity in comparison to right. Tenderness around [facility] and MPC joint on left without palpation of nodular formation.  Obvious clunk felt when extending thumb IP joint and occasional locking of the joint with movement.  5/5  strength with finger ab/adduction, opposition/thumb pronation/supination, wrist F/E, elbow F/E b/l.  Negative Phalen's, Finkelstein's, and Tinel's at wrist/elbow.  Bilateral hands/feet: Sensation intact to light touch throughout.  5/5 upper and lower extremity strength.  No rashes or bruising seen.  2+ radial and dorsalis pedis pulses bilaterally. No edema.   ASSESSMENT/PLAN:   Thumb injury, left, initial encounter Question if 2 different entities are occurring, considerations for carpal tunnel given needlelike pain in median distribution in setting of repetitive hand movements for work + trigger thumb with evident clunking and locking on exam at IP joint.  Could also consider CMC/MPC osteoarthritis contributing.  Will trial cock-up splint nightly for possible carpal tunnel, Tylenol/ibuprofen, and ice/heat. Avoid triggers as possible. Additionally will refer to sports medicine for further evaluation of the above with considerations for alternative splint/injection therapy.  Paresthesia of both feet And hands, stocking/glove distribution.  Subacute.  No objective loss of sensation or weakness on exam.  Will start with lab work-up including CMP, HIV, RPR, A1c, TSH, B12.  Monitor for triggers.  Encouraged appropriate hydration and smoking cessation.  Could consider nerve conduction study in the future.  MDD (major depressive disorder) Uncontrolled with passive SI.  No plan and has daughter for support.  Follows with [facility] and awaiting refills of her medications as she has been out recently.  Reports better control when consistent on her medication.  Aware of crisis line.  Tachycardia Intermittent for the past several years.  Previous work-up showing  consistently sinus and evaluated by cardiology.  Asymptomatic today, reports she currently is quite anxious.  Will monitor closely.    Follow-up in 2 weeks for the above or sooner if needed.  Patriciaann Clan, [place]

## 2020-07-27 NOTE — Patient Instructions (Signed)
It was wonderful to see you today.  For the numbness in your hands and feet: We are going to check a few labs to make sure these are not contributing.  Please make sure that you are drinking plenty of water and consider discontinuing smoking.  For your thumb pain: we will try a splint on your wrist to see if this helps with the needle sensation and have you evaluated by sports medicine.  You can continue to use Tylenol/ibuprofen every 4-6 hours.  Ice the region 20 minutes at a time several times a day as needed.  Please follow-up in 2 weeks so that we can discuss next steps, sooner if needed.  Sports medicine  Address: [address], [place], [place] [address] Phone: [phone]

## 2020-07-28 ENCOUNTER — Telehealth (HOSPITAL_COMMUNITY): Payer: Medicaid Other

## 2020-07-28 LAB — COMPREHENSIVE METABOLIC PANEL
ALT: 12 IU/L (ref 0–32)
AST: 13 IU/L (ref 0–40)
Albumin/Globulin Ratio: 2 (ref 1.2–2.2)
Albumin: 4.6 g/dL (ref 3.8–4.9)
Alkaline Phosphatase: 160 IU/L — ABNORMAL HIGH (ref 48–121)
BUN/Creatinine Ratio: 11 (ref 9–23)
BUN: 8 mg/dL (ref 6–24)
Bilirubin Total: <0.2 mg/dL (ref 0.0–1.2)
CO2: 23 mmol/L (ref 20–29)
Calcium: 10.1 mg/dL (ref 8.7–10.2)
Chloride: 99 mmol/L (ref 96–106)
Creatinine, Ser: 0.72 mg/dL (ref 0.57–1.00)
GFR calc Af Amer: 110 mL/min/{1.73_m2} (ref >59)
GFR calc non Af Amer: 95 mL/min/{1.73_m2} (ref >59)
Globulin, Total: 2.3 g/dL (ref 1.5–4.5)
Glucose: 96 mg/dL (ref 65–99)
Potassium: 4.1 mmol/L (ref 3.5–5.2)
Sodium: 138 mmol/L (ref 134–144)
Total Protein: 6.9 g/dL (ref 6.0–8.5)

## 2020-07-28 LAB — RPR: RPR Ser Ql: NONREACTIVE

## 2020-07-28 LAB — HIV ANTIBODY (ROUTINE TESTING W REFLEX): HIV Screen 4th Generation wRfx: NONREACTIVE

## 2020-07-28 LAB — VITAMIN B12: Vitamin B-12: 798 pg/mL (ref 232–1245)

## 2020-07-28 LAB — TSH: TSH: 4.19 u[IU]/mL (ref 0.450–4.500)

## 2020-07-30 ENCOUNTER — Encounter: Payer: Self-pay | Admitting: Family Medicine

## 2020-07-30 DIAGNOSIS — S6992XA Unspecified injury of left wrist, hand and finger(s), initial encounter: Secondary | ICD-10-CM | POA: Insufficient documentation

## 2020-07-30 DIAGNOSIS — R202 Paresthesia of skin: Secondary | ICD-10-CM | POA: Insufficient documentation

## 2020-07-30 NOTE — Assessment & Plan Note (Addendum)
Uncontrolled with passive SI.  No plan and has daughter for support.  Follows with [facility] and awaiting refills of her medications as she has been out recently.  Reports better control when consistent on her medication.  Aware of crisis line.

## 2020-07-30 NOTE — Assessment & Plan Note (Signed)
And hands, stocking/glove distribution.  Subacute.  No objective loss of sensation or weakness on exam.  Will start with lab work-up including CMP, HIV, RPR, A1c, TSH, B12.  Monitor for triggers.  Encouraged appropriate hydration and smoking cessation.  Could consider nerve conduction study in the future.

## 2020-07-30 NOTE — Assessment & Plan Note (Signed)
Intermittent for the past several years.  Previous work-up showing consistently sinus and evaluated by cardiology.  Asymptomatic today, reports she currently is quite anxious.  Will monitor closely.

## 2020-07-30 NOTE — Assessment & Plan Note (Addendum)
Question if 2 different entities are occurring, considerations for carpal tunnel given needlelike pain in median distribution in setting of repetitive hand movements for work + trigger thumb with evident clunking and locking on exam at IP joint.  Could also consider CMC/MPC osteoarthritis contributing.  Will trial cock-up splint nightly for possible carpal tunnel, Tylenol/ibuprofen, and ice/heat. Avoid triggers as possible. Additionally will refer to sports medicine for further evaluation of the above with considerations for alternative splint/injection therapy.

## 2020-08-06 ENCOUNTER — Ambulatory Visit: Payer: Medicaid Other | Admitting: Family Medicine

## 2020-08-18 ENCOUNTER — Ambulatory Visit: Payer: Medicaid Other | Admitting: Family Medicine

## 2020-08-27 ENCOUNTER — Other Ambulatory Visit: Payer: Self-pay | Admitting: Family Medicine

## 2020-08-27 DIAGNOSIS — M62838 Other muscle spasm: Secondary | ICD-10-CM

## 2020-09-19 ENCOUNTER — Emergency Department (HOSPITAL_BASED_OUTPATIENT_CLINIC_OR_DEPARTMENT_OTHER): Payer: Medicaid Other

## 2020-09-19 ENCOUNTER — Encounter (HOSPITAL_BASED_OUTPATIENT_CLINIC_OR_DEPARTMENT_OTHER): Payer: Self-pay | Admitting: Emergency Medicine

## 2020-09-19 ENCOUNTER — Emergency Department (HOSPITAL_BASED_OUTPATIENT_CLINIC_OR_DEPARTMENT_OTHER)
Admission: EM | Admit: 2020-09-19 | Discharge: 2020-09-19 | Disposition: A | Discharge status: Home / Self Care | Payer: Medicaid Other | Attending: Emergency Medicine | Admitting: Emergency Medicine

## 2020-09-19 ENCOUNTER — Other Ambulatory Visit: Payer: Self-pay

## 2020-09-19 DIAGNOSIS — S93401A Sprain of unspecified ligament of right ankle, initial encounter: Secondary | ICD-10-CM | POA: Diagnosis not present

## 2020-09-19 DIAGNOSIS — W108XXA Fall (on) (from) other stairs and steps, initial encounter: Secondary | ICD-10-CM | POA: Diagnosis not present

## 2020-09-19 DIAGNOSIS — M79671 Pain in right foot: Secondary | ICD-10-CM | POA: Insufficient documentation

## 2020-09-19 DIAGNOSIS — Z79899 Other long term (current) drug therapy: Secondary | ICD-10-CM | POA: Diagnosis not present

## 2020-09-19 DIAGNOSIS — F17210 Nicotine dependence, cigarettes, uncomplicated: Secondary | ICD-10-CM | POA: Insufficient documentation

## 2020-09-19 DIAGNOSIS — J45909 Unspecified asthma, uncomplicated: Secondary | ICD-10-CM | POA: Diagnosis not present

## 2020-09-19 DIAGNOSIS — S99911A Unspecified injury of right ankle, initial encounter: Secondary | ICD-10-CM | POA: Diagnosis present

## 2020-09-19 MED ORDER — DICLOFENAC SODIUM 1 % EX GEL: 2.0000 g | Freq: Four times a day (QID) | CUTANEOUS | 0 refills | Status: DC

## 2020-09-19 NOTE — Discharge Instructions (Signed)
Wear the ankle brace as directed. Follow-up with orthopedic doctor listed below. Bear weight as tolerated. Take medications to help with pain and swelling. Return to the ER for worsening pain, swelling, additional injuries, redness or warmth of joint.

## 2020-09-19 NOTE — ED Notes (Signed)
Review D/C papers with pt, reviewed Rx with pt, pt states understanding, pt denies questions at this time. 

## 2020-09-19 NOTE — ED Triage Notes (Addendum)
Pt c/o pain to right leg x 3 days. Pt denies injury. Pt reports that she sits for about 7 hours at night when working. Pt denies shortness of breath or chest pain.
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
## 2020-09-27 ENCOUNTER — Telehealth (HOSPITAL_BASED_OUTPATIENT_CLINIC_OR_DEPARTMENT_OTHER): Payer: Self-pay | Admitting: Emergency Medicine

## 2020-09-30 ENCOUNTER — Other Ambulatory Visit: Payer: Self-pay | Admitting: Family Medicine

## 2020-09-30 DIAGNOSIS — M62838 Other muscle spasm: Secondary | ICD-10-CM

## 2020-10-14 ENCOUNTER — Other Ambulatory Visit: Payer: Self-pay | Admitting: Family Medicine

## 2020-10-14 DIAGNOSIS — M62838 Other muscle spasm: Secondary | ICD-10-CM

## 2020-12-30 ENCOUNTER — Encounter: Payer: Medicaid Other | Admitting: Family Medicine

## 2021-01-25 ENCOUNTER — Other Ambulatory Visit: Payer: Self-pay | Admitting: Family Medicine

## 2021-01-25 DIAGNOSIS — M62838 Other muscle spasm: Secondary | ICD-10-CM

## 2021-02-02 ENCOUNTER — Other Ambulatory Visit: Payer: Self-pay | Admitting: Family Medicine

## 2021-02-02 DIAGNOSIS — M62838 Other muscle spasm: Secondary | ICD-10-CM

## 2021-02-17 ENCOUNTER — Other Ambulatory Visit: Payer: Self-pay | Admitting: Family Medicine

## 2021-02-17 DIAGNOSIS — M62838 Other muscle spasm: Secondary | ICD-10-CM

## 2021-03-29 ENCOUNTER — Encounter: Payer: Medicaid Other | Admitting: Family Medicine
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
## 2021-03-30 ENCOUNTER — Other Ambulatory Visit: Payer: Self-pay

## 2021-03-30 ENCOUNTER — Other Ambulatory Visit (HOSPITAL_COMMUNITY)
Admission: RE | Admit: 2021-03-30 | Discharge: 2021-03-30 | Disposition: A | Discharge status: Home / Self Care | Payer: Medicaid Other | Source: Ambulatory Visit | Attending: Family Medicine | Admitting: Family Medicine

## 2021-03-30 ENCOUNTER — Ambulatory Visit (INDEPENDENT_AMBULATORY_CARE_PROVIDER_SITE_OTHER): Payer: Medicaid Other | Admitting: Family Medicine

## 2021-03-30 ENCOUNTER — Encounter: Payer: Self-pay | Admitting: Family Medicine

## 2021-03-30 ENCOUNTER — Ambulatory Visit (INDEPENDENT_AMBULATORY_CARE_PROVIDER_SITE_OTHER): Payer: Medicaid Other

## 2021-03-30 VITALS — BP 112/75 | HR 112 | Ht 64.0 in | Wt 105.6 lb

## 2021-03-30 DIAGNOSIS — Z124 Encounter for screening for malignant neoplasm of cervix: Secondary | ICD-10-CM | POA: Diagnosis not present

## 2021-03-30 DIAGNOSIS — E785 Hyperlipidemia, unspecified: Secondary | ICD-10-CM | POA: Diagnosis not present

## 2021-03-30 DIAGNOSIS — Z113 Encounter for screening for infections with a predominantly sexual mode of transmission: Secondary | ICD-10-CM

## 2021-03-30 DIAGNOSIS — Z0000 Encounter for general adult medical examination without abnormal findings: Secondary | ICD-10-CM

## 2021-03-30 DIAGNOSIS — L989 Disorder of the skin and subcutaneous tissue, unspecified: Secondary | ICD-10-CM

## 2021-03-30 DIAGNOSIS — Z716 Tobacco abuse counseling: Secondary | ICD-10-CM

## 2021-03-30 DIAGNOSIS — Z23 Encounter for immunization: Secondary | ICD-10-CM | POA: Diagnosis not present

## 2021-03-30 DIAGNOSIS — Z1231 Encounter for screening mammogram for malignant neoplasm of breast: Secondary | ICD-10-CM

## 2021-03-30 NOTE — Patient Instructions (Signed)
It was wonderful to see you today.  We have performed a Pap smear today, those results should be back in the next several days.  I will send you a MyChart message when they return.  Additionally I will place referral to GI for screening colonoscopy, you should hear from their office in the next 2-3 weeks to get that scheduled.  If you do not hear from anyone, please call our office.  Lastly I put an order for a mammogram, you can call the breast imaging center at your convenience to get that scheduled.  Keep an eye on the new skin lesion, it overall looks benign.  If it starts getting larger, painful, changes borders please follow-up.

## 2021-03-30 NOTE — Progress Notes (Signed)
    SUBJECTIVE:   Chief compliant/HPI: annual examination  [name] is a 55 y.o. who presents today for an annual exam.   Health maintenance due:  Colon cancer screening: Has not had them before.  No family or personal history of colon cancer.  Breast cancer screening: Last mammogram was several years ago.  No family history of breast cancer.  Cervical cancer screening: Last Pap smear normal in 2012.  Due for this.  COVID-vaccine booster: Received 2 doses of Moderna.  Interested in booster.  She would also like STD screening today with her Pap smear.  She also has a skin lesion that popped up on her middle back about a few weeks ago.  It is nonpainful and nonpruritic, however she would like it taken a look at.  She also has a mole in a similar area that has been present for several years and has not changed in size, borders, or color.  No other new lesions.  History tabs reviewed and updated yes.    OBJECTIVE:   BP 112/75   Pulse (!) 112   Ht $R'5\' 4"'tL$  (1.626 m)   Wt 105 lb 9.6 oz (47.9 kg)   SpO2 98%   BMI 18.13 kg/m   General: Alert, NAD HEENT: NCAT, MMM Cardiac: tachycardic, regular rhythm  Lungs: No increased WOB  Abdomen: soft, non-tender, non-distended Msk: Normal gait  Ext: Warm, dry, 2+ distal pulses Derm: few mm "stuck on" lesion present on upper central back, immediately inferior to several mm flat hyperpigmented lesion Pelvic exam: VULVA: normal appearing vulva with no masses, tenderness or lesions, VAGINA: normal appearing vagina with normal color and discharge, no lesions, CERVIX: normal appearing cervix without discharge or lesions, UTERUS: uterus is normal size, shape, consistency and nontender, ADNEXA: normal adnexa in size, nontender and no masses.  Chaperoned by CMA.      ASSESSMENT/PLAN:   Annual physical exam See AVS for age appropriate recommendations  BP reviewed and at goal.  Screening for elevated cholesterol: ordered HIV testing:  ordered Hepatitis C: ordered Syphilis if at high risk: ordered GC/CT ordered.  Reviewed risk factors for latent tuberculosis and not indicated  Discussed family history, BRCA testing not indicated. Cervical cancer screening: due for Pap today, cytology + HPV ordered Breast cancer screening: discussed and ordered mammogram based upon personal history  Colorectal cancer screening: discussed, colonoscopy ordered Lung cancer screening: Will discuss on follow up. 29 pack year history.  Vaccinations: Received COVID booster today, tolerated well.   Skin lesion Overall appears consistent with seborrheic keratosis given "stuck on" appearance.  Provided reassurance and recommended observation.  Follow-up if enlarging, changing color, asymmetry, or painful/bothersome.  Tobacco abuse counseling 1 pack/day, 29-pack-year history.  In contemplative phase.  Will encourage and address on follow-up visits.    Follow-up in the next few months for chronic conditions or sooner if needed.  Update: Informed by lab technician they were unable to obtain labs due to difficult stick/dehydration.  Will have patient schedule a follow-up lab visit to get these completed.   Patriciaann Clan, [place]

## 2021-03-31 ENCOUNTER — Encounter: Payer: Self-pay | Admitting: Family Medicine

## 2021-03-31 DIAGNOSIS — L989 Disorder of the skin and subcutaneous tissue, unspecified: Secondary | ICD-10-CM | POA: Insufficient documentation

## 2021-03-31 DIAGNOSIS — Z0000 Encounter for general adult medical examination without abnormal findings: Secondary | ICD-10-CM | POA: Insufficient documentation

## 2021-03-31 NOTE — Assessment & Plan Note (Signed)
See AVS for age appropriate recommendations  BP reviewed and at goal.  Screening for elevated cholesterol: ordered HIV testing: ordered Hepatitis C: ordered Syphilis if at high risk: ordered GC/CT ordered.  Reviewed risk factors for latent tuberculosis and not indicated  Discussed family history, BRCA testing not indicated. Cervical cancer screening: due for Pap today, cytology + HPV ordered Breast cancer screening: discussed and ordered mammogram based upon personal history  Colorectal cancer screening: discussed, colonoscopy ordered Lung cancer screening: Will discuss on follow up. 29 pack year history.  Vaccinations: Received COVID booster today, tolerated well.

## 2021-03-31 NOTE — Assessment & Plan Note (Signed)
Overall appears consistent with seborrheic keratosis given "stuck on" appearance.  Provided reassurance and recommended observation.  Follow-up if enlarging, changing color, asymmetry, or painful/bothersome.

## 2021-03-31 NOTE — Assessment & Plan Note (Signed)
1 pack/day, 29-pack-year history.  In contemplative phase.  Will encourage and address on follow-up visits.

## 2021-04-01 LAB — CYTOLOGY - PAP
Chlamydia: NEGATIVE
Comment: NEGATIVE
Comment: NEGATIVE
Comment: NEGATIVE
Comment: NORMAL
Diagnosis: NEGATIVE
High risk HPV: NEGATIVE
Neisseria Gonorrhea: NEGATIVE
Trichomonas: NEGATIVE

## 2021-04-27 ENCOUNTER — Other Ambulatory Visit: Payer: Self-pay | Admitting: Family Medicine

## 2021-04-27 DIAGNOSIS — M62838 Other muscle spasm: Secondary | ICD-10-CM

## 2021-07-04 ENCOUNTER — Other Ambulatory Visit: Payer: Self-pay | Admitting: Family Medicine

## 2021-07-04 DIAGNOSIS — M62838 Other muscle spasm: Secondary | ICD-10-CM

## 2021-07-27 ENCOUNTER — Ambulatory Visit: Payer: Medicaid Other

## 2021-07-29 ENCOUNTER — Ambulatory Visit: Payer: Medicaid Other

## 2021-09-07 ENCOUNTER — Other Ambulatory Visit: Payer: Self-pay | Admitting: Family Medicine

## 2021-09-07 DIAGNOSIS — M62838 Other muscle spasm: Secondary | ICD-10-CM

## 2021-09-08 NOTE — Telephone Encounter (Signed)
Denied request for Baclofen refill. Patient needs office visit to discuss. Per chart review, no indication for chronic Baclofen use.

## 2021-11-01 ENCOUNTER — Ambulatory Visit: Payer: Medicaid Other | Admitting: Family Medicine

## 2021-11-01 NOTE — Patient Instructions (Incomplete)
° °  Today at your annual preventive visit we talked about the following measures: -Colonoscopy for colon cancer screening: I have placed a referral to GI. They will call you for an appointment -Mammogram for breast cancer screening: call [facility] at [phone]. You can also try [phone] -Lung cancer screening: I have ordered a CT scan. After insurance approval, someone will call you to schedule.   I recommend 150 minutes of exercise per week-try 30 minutes 5 days per week We discussed reducing sugary beverages (like soda and juice) and increasing leafy greens and whole fruits.  We discussed avoiding tobacco and alcohol.  I recommend avoiding illicit substances.  Your blood pressure is *** at goal of ***.

## 2021-11-01 NOTE — Progress Notes (Deleted)
    SUBJECTIVE:   Chief compliant/HPI: annual examination  [name] is a 55 y.o. who presents today for an annual exam.   Health Maintenance Due: -Lung cancer screening (tobacco use 1PPD x29 years) -Mammogram -Colonoscopy -Flu shot  Current complaints: -  History tabs reviewed and updated ***.  PMHx: bipolar disorder (follows w/psychiatry, takes lithium, seroquel, trazodone, and mirtazapine), asthma, GERD, HLD, sinus tachycardia Meds: why baclofen?* why phenergan?*  Review of systems form reviewed and notable for ***.   OBJECTIVE:   There were no vitals taken for this visit.  ***  ASSESSMENT/PLAN:   No problem-specific Assessment & Plan notes found for this encounter.    Annual Examination  See AVS for age appropriate recommendations  PHQ score ***, reviewed and discussed.  BP reviewed and at goal ***.   Considered the following items based upon USPSTF recommendations: Diabetes screening:  not indicated Screening for elevated cholesterol: ordered* HIV testing: ordered Hepatitis C: ordered Syphilis if at high risk: ordered GC/CT {GC/CT screening :23818} Reviewed risk factors for latent tuberculosis and not indicated  Cervical cancer screening: prior Pap reviewed, 03/30/2021- NILM, negative HPV, repeat due in 5 years (April 2027) Breast cancer screening:  mammogram ordered on 03/31/2021. Colorectal cancer screening: discussed, colonoscopy ordered*** Lung cancer screening:  ordered . See documentation below regarding indications/risks/benefits.  Vaccinations ***.   Tobacco Use ***  Follow up in 1 *** year or sooner if indicated.    Alcus Dad, MD [place]

## 2021-12-02 ENCOUNTER — Ambulatory Visit: Payer: Medicaid Other | Admitting: Family Medicine

## 2021-12-02 NOTE — Progress Notes (Deleted)
° ° °  SUBJECTIVE:   Chief complaint/HPI:   [name] is a 55 y.o. who presents today for ***   PMHx: bipolar disorder, tobacco use, migraine, GERD, HLD  Health Maintenance Due for low dose lung cancer screening (?) Lipid panel Colonoscopy Mammogram  Labs unable to be obtained in April (difficult stick)  OBJECTIVE:   There were no vitals taken for this visit.  ***  ASSESSMENT/PLAN:   No problem-specific Assessment & Plan notes found for this encounter.   HLD Due for lipid panel   Health Maintenance PHQ score ***, reviewed and discussed.  BP reviewed and at goal ***.   Considered the following items based upon USPSTF recommendations: HIV testing: {discussed/ordered:14545} Hepatitis C: {discussed/ordered:14545} Syphilis if at high risk: {discussed/ordered:14545}  Cervical cancer screening: {PAPTYPE:23819} Breast cancer screening: {mammoscreen:23820} Colorectal cancer screening: {crcscreen:23821::"discussed, colonoscopy ordered"} Lung cancer screening: {discussed/declined/written NFAO:13086}. See documentation below regarding indications/risks/benefits.  Vaccinations ***.   Follow up in 1 *** year or sooner if indicated.    Alcus Dad, MD [place]

## 2021-12-06 ENCOUNTER — Other Ambulatory Visit: Payer: Self-pay

## 2021-12-06 ENCOUNTER — Ambulatory Visit (INDEPENDENT_AMBULATORY_CARE_PROVIDER_SITE_OTHER): Payer: Medicaid Other | Admitting: Family Medicine

## 2021-12-06 VITALS — BP 100/60 | HR 97 | Temp 98.9°F | Ht 64.0 in | Wt 107.4 lb

## 2021-12-06 DIAGNOSIS — Z1211 Encounter for screening for malignant neoplasm of colon: Secondary | ICD-10-CM | POA: Diagnosis not present

## 2021-12-06 DIAGNOSIS — Z13220 Encounter for screening for lipoid disorders: Secondary | ICD-10-CM | POA: Diagnosis not present

## 2021-12-06 DIAGNOSIS — Z1159 Encounter for screening for other viral diseases: Secondary | ICD-10-CM | POA: Diagnosis not present

## 2021-12-06 DIAGNOSIS — U071 COVID-19: Secondary | ICD-10-CM | POA: Diagnosis present

## 2021-12-06 DIAGNOSIS — Z113 Encounter for screening for infections with a predominantly sexual mode of transmission: Secondary | ICD-10-CM

## 2021-12-06 DIAGNOSIS — Z114 Encounter for screening for human immunodeficiency virus [HIV]: Secondary | ICD-10-CM

## 2021-12-06 MED ORDER — ZOSTER VAC RECOMB ADJUVANTED 50 MCG/0.5ML IM SUSR: 0.5000 mL | Freq: Once | INTRAMUSCULAR | 0 refills | Status: AC

## 2021-12-06 MED ORDER — ONDANSETRON HCL 4 MG PO TABS: 4.0000 mg | ORAL_TABLET | Freq: Three times a day (TID) | ORAL | 0 refills | Status: DC | PRN

## 2021-12-06 MED ORDER — ALBUTEROL SULFATE HFA 108 (90 BASE) MCG/ACT IN AERS: 2.0000 | INHALATION_SPRAY | Freq: Four times a day (QID) | RESPIRATORY_TRACT | 0 refills | Status: AC

## 2021-12-06 NOTE — Patient Instructions (Addendum)
It was nice seeing you today!  Take Zofran as needed for nausea.  For cough, I recommend honey with or without tea.  Please give [place] a call or go to the ED if you develop chest pain or shortness of breath.  Please return when you are feeling better to get the new Covid booster, flu shot, and Tdap vaccine as well as your blood work.  Get your shingles vaccine at the pharmacy when you are feeling better.  Referral for colonoscopy.  Stay well, Zola Button, MD [place] [phone]

## 2021-12-06 NOTE — Progress Notes (Signed)
° ° °  SUBJECTIVE:   CHIEF COMPLAINT / HPI:   Tested positive for COVID on 12/01/21.  Symptoms started that day and include cough, nausea, vomiting, diarrhea, congestion, lightheadedness when standing up.  She has had low-grade temperatures with T-max 100 F. Had 3 episodes of diarrhea yesterday. Having some nausea and vomiting, threw up twice yesterday and none today. Has been drinking fluids. Taking Mucinex for symptoms. Denies chest pain, SOB.  Feels symptoms are overall improving.   Albuterol last refilled in 2019, she is asking for a refill of this.  She is also here for blood work, attempted to obtain at physical back in April 2022 but was unsuccessful blood draw-plan to return for this but had never returned.  PERTINENT  PMH / PSH: Asthma, migraines, GERD, bipolar disorder, GAD, HLD, tobacco use  OBJECTIVE:   BP 100/60    Pulse 97    Temp 98.9 F (37.2 C)    Ht 5\' 4"  (1.626 m)    Wt 107 lb 6.4 oz (48.7 kg)    SpO2 98%    BMI 18.44 kg/m   General: Alert, tired appearing, NAD HEENT: MMM, posterior oropharynx clear CV: RRR, no murmurs Pulm: CTAB, no wheezes or rales, breathing comfortably on room air Abdomen: Soft, nontender  Wt Readings from Last 3 Encounters:  12/06/21 107 lb 6.4 oz (48.7 kg)  03/30/21 105 lb 9.6 oz (47.9 kg)  09/19/20 120 lb (54.4 kg)     ASSESSMENT/PLAN:   Covid-19 infection Mild infection, day 5 of quarantine.  Questionable history of asthma do not feel she is in exacerbation or respiratory distress at this time.  Overall symptoms are improving.  Out of window for antivirals. - quarantine until symptoms resolved - Zofran prn - honey for cough - return precautions given  HCM - colonoscopy, referral placed - shingles vaccine ordered to pharmacy - Tdap due, advised to return for this - flu shot due, advised to return for this - Covid bivalent booster due, advised to return for this  Due to current COVID infection, I advised her to return another time to  have her blood work drawn to reduce spread of infection.  Future lab orders placed.  She will also return to have her flu, Tdap, and COVID bivalent booster.  Advised to call office prior to this.  Zola Button, MD [place]

## 2022-01-31 ENCOUNTER — Ambulatory Visit (INDEPENDENT_AMBULATORY_CARE_PROVIDER_SITE_OTHER): Payer: Medicaid Other

## 2022-01-31 ENCOUNTER — Encounter (HOSPITAL_COMMUNITY): Payer: Self-pay | Admitting: Emergency Medicine

## 2022-01-31 ENCOUNTER — Other Ambulatory Visit: Payer: Self-pay

## 2022-01-31 ENCOUNTER — Ambulatory Visit (HOSPITAL_COMMUNITY)
Admission: EM | Admit: 2022-01-31 | Discharge: 2022-01-31 | Disposition: A | Discharge status: Home / Self Care | Payer: Medicaid Other | Attending: Emergency Medicine | Admitting: Emergency Medicine

## 2022-01-31 DIAGNOSIS — R059 Cough, unspecified: Secondary | ICD-10-CM | POA: Diagnosis not present

## 2022-01-31 DIAGNOSIS — R55 Syncope and collapse: Secondary | ICD-10-CM

## 2022-01-31 DIAGNOSIS — J40 Bronchitis, not specified as acute or chronic: Secondary | ICD-10-CM

## 2022-01-31 DIAGNOSIS — R0989 Other specified symptoms and signs involving the circulatory and respiratory systems: Secondary | ICD-10-CM

## 2022-01-31 MED ORDER — DOXYCYCLINE HYCLATE 100 MG PO CAPS: 100.0000 mg | ORAL_CAPSULE | Freq: Two times a day (BID) | ORAL | 0 refills | Status: AC

## 2022-01-31 NOTE — ED Provider Notes (Addendum)
[name]   MRN: [id number] DOB: 01-12-1966  Subjective:   [name] is a 56 y.o. female who smokes presents for productive cough with green sputum and lightheaded episodes resulting in syncope x2 in the last 2 weeks.  Patient states she was at home both times when she passed out she began to feel lightheaded and the next thing she knew she was on the floor waking up she believes the periods lasted approximately 20 to 30 minutes but was unwitnessed so she is not sure, she denies trauma.  She had COVID 2 months ago and has history of bipolar, anemia, anxiety and chronic pain.  Patient is using her inhaler and has intermittent shortness of breath.  She denies chest pain, dysuria, known fevers.  She denies missing any doses of her medications and has been hydrating well.  No current facility-administered medications for this encounter.  Current Outpatient Medications:    albuterol (VENTOLIN HFA) 108 (90 Base) MCG/ACT inhaler, Inhale 2 puffs into the lungs 4 (four) times daily., Disp: 1 each, Rfl: 0   baclofen (LIORESAL) 10 MG tablet, TAKE 1 TABLET BY MOUTH THREE TIMES A DAY AS NEEDED FOR MUSCLE SPASM, Disp: 30 tablet, Rfl: 1   cetirizine (ZYRTEC) 10 MG tablet, Take 1 tablet (10 mg total) by mouth daily., Disp: 90 tablet, Rfl: 3   diclofenac Sodium (VOLTAREN) 1 % GEL, Apply 2 g topically 4 (four) times daily., Disp: 50 g, Rfl: 0   Elastic Bandages & Supports (WRIST SPLINT/COCK-UP/LEFT SM) MISC, Wear nightly, Disp: 1 each, Rfl: 0   fluticasone (FLONASE) 50 MCG/ACT nasal spray, Place 2 sprays into both nostrils daily., Disp: 16 g, Rfl: 6   lithium carbonate 300 MG capsule, Take 1 capsule (300 mg total) by mouth 2 (two) times daily with a meal., Disp: 60 capsule, Rfl: 0   mirtazapine (REMERON) 30 MG tablet, Take 1 tablet (30 mg total) by mouth at bedtime., Disp: 30 tablet, Rfl: 0   ondansetron (ZOFRAN) 4 MG tablet, Take 1 tablet (4 mg total) by mouth every 8 (eight) hours as  needed for nausea or vomiting., Disp: 20 tablet, Rfl: 0   promethazine (PHENERGAN) 12.5 MG tablet, Take 1 tablet (12.5 mg total) by mouth every 8 (eight) hours as needed for nausea or vomiting., Disp: 20 tablet, Rfl: 0   QUEtiapine (SEROQUEL) 300 MG tablet, Take 1 tablet (300 mg total) by mouth 2 (two) times daily., Disp: 60 tablet, Rfl: 0   TRAZODONE HCL PO, Take 1 tablet by mouth at bedtime., Disp: , Rfl:    Allergies  Allergen Reactions   Penicillins     Not sure of reaction. Pt never used it because of mother told her    Past Medical History:  Diagnosis Date   Anxiety    Back pain    Bipolar affective ([place])    Depression    Muscle spasm of left lower extremity 05/15/2016   Palpitations 03/02/2015     Past Surgical History:  Procedure Laterality Date   NECK SURGERY      History reviewed. No pertinent family history.  Social History   Tobacco Use   Smoking status: Every Day    Packs/day: 0.80    Types: Cigarettes   Smokeless tobacco: Never   Tobacco comments:    would like to love to quitt per pt  Vaping Use   Vaping Use: Never used  Substance Use Topics   Alcohol use: Yes   Drug use: Yes  Types: Marijuana    Review of Systems  All other systems reviewed and are negative.   Objective:   Vitals: BP 127/71 (BP Location: Left Arm)    Pulse (!) 103    Temp 98.7 F (37.1 C) (Oral)    Resp 18    Ht 5\' 4"  (1.626 m)    Wt 107 lb 5.8 oz (48.7 kg)    SpO2 100%    BMI 18.43 kg/m   Orthostatic VS for the past 72 hrs (Last 3 readings):  BP Location  01/31/22 1742 Left Arm      Physical Exam Vitals and nursing note reviewed.  Constitutional:      General: She is not in acute distress.    Appearance: She is well-developed.  HENT:     Head: Normocephalic and atraumatic.  Eyes:     Conjunctiva/sclera: Conjunctivae normal.  Cardiovascular:     Rate and Rhythm: Normal rate and regular rhythm.     Heart sounds: No murmur heard.    Comments: Borderline  tachycardia at 101. Pulmonary:     Effort: Pulmonary effort is normal. No respiratory distress.     Breath sounds: No stridor. Wheezing and rhonchi present. No rales.  Abdominal:     Palpations: Abdomen is soft.     Tenderness: There is no abdominal tenderness.  Musculoskeletal:        General: No swelling.     Cervical back: Neck supple.  Skin:    General: Skin is warm and dry.     Capillary Refill: Capillary refill takes less than 2 seconds.  Neurological:     Mental Status: She is alert.  Psychiatric:        Mood and Affect: Mood normal.        Thought Content: Thought content normal.    No results found for this or any previous visit (from the past 24 hour(s)).  DG Chest 2 View  Result Date: 01/31/2022 CLINICAL DATA:  Severe cough and congestion EXAM: CHEST - 2 VIEW COMPARISON:  09/16/2014 FINDINGS: The heart size and mediastinal contours are within normal limits. Both lungs are clear. The visualized skeletal structures are unremarkable. Bilateral nipple shadows. ACDF cervical spine. IMPRESSION: No active cardiopulmonary disease. Electronically Signed   By: Franchot Gallo M.D.   On: 01/31/2022 18:16     Assessment and Plan :   PDMP not reviewed this encounter.  MDM: [name] is a 56 y.o. female presenting for productive cough and also mentions 2 syncopal episodes that she has not seen her primary care provider for.  Her exam is unremarkable except for her lungs where she has scattered wheezes and rhonchi throughout.  She is speaking in full sentences and has an oxygen saturation of 100% on room air.  Orthostatic VS were unremarkable. Chest x-ray revealed no evidence of pneumonia. Pt will start doxycycline and will fu with her provider regarding recent passing out episodes for regular blood blood. Questions are answered, pt is agreeable with plan and stable for discharge 1. Bronchitis   2. Syncope and collapse       Hezzie Bump, NP 01/31/22 1850    Hezzie Bump,  NP 02/01/22 0809    Hezzie Bump, NP 02/01/22 [phone]

## 2022-01-31 NOTE — ED Triage Notes (Signed)
Pt reports cough and nasal congestion x 2 weeks. Pt also states she had two syncope episodes in the past two weeks and was dx with Covid 2 months ago.

## 2022-01-31 NOTE — Discharge Instructions (Addendum)
Take antibiotic as directed.  Drink plenty of fluids follow-up with your doctor for reevaluation of your medications and follow-up labs to recheck your blood levels given your history of anemia.  Decrease use of muscle relaxant as it may be the cause of your syncopal episodes.  Return if not improving, new or worsening symptoms for reevaluation

## 2022-02-09 ENCOUNTER — Telehealth: Payer: Self-pay

## 2022-02-09 NOTE — Telephone Encounter (Signed)
Patient calls nurse line regarding medication side effects with doxycycline. Patient reports starting medication on Sunday, 3/5. Patient states that she is unable to keep medication down and is requesting an alternative.  ? ?Advised patient that as we did not prescribe, she may need an appointment for medication adjustments. Offered to schedule for tomorrow afternoon, patient is unable to come in due to work schedule.  ? ?Please advise.  ? ?Talbot Grumbling, RN ? ?

## 2022-02-10 NOTE — Telephone Encounter (Signed)
Called patient and informed of provider recommendation. Scheduled follow up appointment in clinic on Monday morning.  ? ?Talbot Grumbling, RN ? ?

## 2022-02-13 ENCOUNTER — Ambulatory Visit: Payer: Medicaid Other

## 2022-05-09 ENCOUNTER — Encounter: Payer: Self-pay | Admitting: *Deleted

## 2023-10-26 ENCOUNTER — Encounter (HOSPITAL_COMMUNITY): Payer: Self-pay

## 2023-10-26 ENCOUNTER — Ambulatory Visit (HOSPITAL_COMMUNITY)
Admission: EM | Admit: 2023-10-26 | Discharge: 2023-10-26 | Disposition: A | Discharge status: Home / Self Care | Payer: Self-pay

## 2023-10-26 DIAGNOSIS — S39012A Strain of muscle, fascia and tendon of lower back, initial encounter: Secondary | ICD-10-CM

## 2023-10-26 MED ORDER — KETOROLAC TROMETHAMINE 30 MG/ML IJ SOLN
30.0000 mg | Freq: Once | INTRAMUSCULAR | Status: AC
  Administered 2023-10-26: 30 mg via INTRAMUSCULAR

## 2023-10-26 MED ORDER — KETOROLAC TROMETHAMINE 30 MG/ML IJ SOLN
INTRAMUSCULAR | Status: AC
  Filled 2023-10-26: qty 1

## 2023-10-26 MED ORDER — DICLOFENAC SODIUM 75 MG PO TBEC: 75.0000 mg | DELAYED_RELEASE_TABLET | Freq: Two times a day (BID) | ORAL | 0 refills | Status: AC

## 2023-10-26 MED ORDER — CYCLOBENZAPRINE HCL 10 MG PO TABS: 10.0000 mg | ORAL_TABLET | Freq: Three times a day (TID) | ORAL | 0 refills | Status: AC | PRN
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
## 2023-10-26 NOTE — Discharge Instructions (Addendum)
Follow up with your Physician for recheck.  Return if any problems.

## 2023-10-26 NOTE — ED Triage Notes (Signed)
Back Spasm onset yesterday. Patient was lifting a 48 count case of water. Patient has history of back pain that had not bothered her for some time now.   Patient has not taken anything for the pain.

## 2024-08-18 ENCOUNTER — Emergency Department (HOSPITAL_COMMUNITY): Payer: Self-pay

## 2024-08-18 ENCOUNTER — Other Ambulatory Visit: Payer: Self-pay

## 2024-08-18 ENCOUNTER — Encounter (HOSPITAL_COMMUNITY): Payer: Self-pay

## 2024-08-18 ENCOUNTER — Emergency Department (HOSPITAL_COMMUNITY)
Admission: EM | Admit: 2024-08-18 | Discharge: 2024-08-18 | Disposition: A | Discharge status: Home / Self Care | Payer: Self-pay | Attending: Emergency Medicine | Admitting: Emergency Medicine

## 2024-08-18 DIAGNOSIS — S93401A Sprain of unspecified ligament of right ankle, initial encounter: Secondary | ICD-10-CM | POA: Insufficient documentation

## 2024-08-18 DIAGNOSIS — X501XXA Overexertion from prolonged static or awkward postures, initial encounter: Secondary | ICD-10-CM | POA: Insufficient documentation

## 2024-08-18 DIAGNOSIS — Y9302 Activity, running: Secondary | ICD-10-CM | POA: Insufficient documentation

## 2024-08-18 MED ORDER — OXYCODONE HCL 5 MG PO TABS
5.0000 mg | ORAL_TABLET | Freq: Once | ORAL | Status: AC
  Administered 2024-08-18: 5 mg via ORAL
  Filled 2024-08-18: qty 1

## 2024-08-18 MED ORDER — NAPROXEN 500 MG PO TABS: 500.0000 mg | ORAL_TABLET | Freq: Two times a day (BID) | ORAL | 0 refills | Status: AC

## 2024-08-18 MED ORDER — NAPROXEN 500 MG PO TABS: 500.0000 mg | ORAL_TABLET | Freq: Two times a day (BID) | ORAL | 0 refills | Status: DC

## 2024-08-18 MED ORDER — HYDROCODONE-ACETAMINOPHEN 5-325 MG PO TABS: 2.0000 | ORAL_TABLET | Freq: Four times a day (QID) | ORAL | 0 refills | Status: DC | PRN

## 2024-08-18 MED ORDER — HYDROCODONE-ACETAMINOPHEN 5-325 MG PO TABS: 1.0000 | ORAL_TABLET | Freq: Four times a day (QID) | ORAL | 0 refills | Status: AC | PRN

## 2024-08-18 NOTE — Progress Notes (Signed)
 Orthopedic Tech Progress Note Patient Details:  [name] 1966/07/14 [id number] Applied ASO lace up brace per order. Also, adjusted crutches for pt.  Ortho Devices Type of Ortho Device: Crutches, ASO Ortho Device/Splint Location: RLE Ortho Device/Splint Interventions: Ordered, Application, Adjustment   Post Interventions Patient Tolerated: Well Instructions Provided: Adjustment of device, Care of device, Poper ambulation with device  Morna Pink 08/18/2024, 2:50 PM

## 2024-08-18 NOTE — Discharge Instructions (Addendum)
 Take naproxen  twice daily.  You can take 650 mg of Tylenol  every 8 6 to 8 hours.  You can take Norco for breakthrough pain.  I would also recommend using ankle brace when you are up walking around, use crutches as needed, follow-up with orthopedics.
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
## 2024-11-05 ENCOUNTER — Other Ambulatory Visit: Payer: Self-pay

## 2024-11-05 ENCOUNTER — Emergency Department (HOSPITAL_COMMUNITY)
Admission: EM | Admit: 2024-11-05 | Discharge: 2024-11-05 | Disposition: A | Discharge status: Home / Self Care | Payer: Self-pay | Attending: Emergency Medicine | Admitting: Emergency Medicine

## 2024-11-05 ENCOUNTER — Encounter (HOSPITAL_COMMUNITY): Payer: Self-pay

## 2024-11-05 ENCOUNTER — Emergency Department (HOSPITAL_COMMUNITY): Payer: Self-pay

## 2024-11-05 DIAGNOSIS — Z79899 Other long term (current) drug therapy: Secondary | ICD-10-CM | POA: Insufficient documentation

## 2024-11-05 DIAGNOSIS — K047 Periapical abscess without sinus: Secondary | ICD-10-CM | POA: Insufficient documentation

## 2024-11-05 DIAGNOSIS — D72829 Elevated white blood cell count, unspecified: Secondary | ICD-10-CM | POA: Insufficient documentation

## 2024-11-05 DIAGNOSIS — F17210 Nicotine dependence, cigarettes, uncomplicated: Secondary | ICD-10-CM | POA: Insufficient documentation

## 2024-11-05 LAB — COMPREHENSIVE METABOLIC PANEL WITH GFR
ALT: 7 U/L (ref 0–44)
AST: 19 U/L (ref 15–41)
Albumin: 4.6 g/dL (ref 3.5–5.0)
Alkaline Phosphatase: 162 U/L — ABNORMAL HIGH (ref 38–126)
Anion gap: 14 (ref 5–15)
BUN: 5 mg/dL — ABNORMAL LOW (ref 6–20)
CO2: 21 mmol/L — ABNORMAL LOW (ref 22–32)
Calcium: 10 mg/dL (ref 8.9–10.3)
Chloride: 101 mmol/L (ref 98–111)
Creatinine, Ser: 0.8 mg/dL (ref 0.44–1.00)
GFR, Estimated: >60 mL/min (ref >60)
Glucose, Bld: 150 mg/dL — ABNORMAL HIGH (ref 70–99)
Potassium: 3.4 mmol/L — ABNORMAL LOW (ref 3.5–5.1)
Sodium: 137 mmol/L (ref 135–145)
Total Bilirubin: <0.2 mg/dL (ref 0.0–1.2)
Total Protein: 7.7 g/dL (ref 6.5–8.1)

## 2024-11-05 LAB — CBC WITH DIFFERENTIAL/PLATELET
Abs Immature Granulocytes: 0.07 K/uL (ref 0.00–0.07)
Basophils Absolute: 0 K/uL (ref 0.0–0.1)
Basophils Relative: 0 %
Eosinophils Absolute: 0.1 K/uL (ref 0.0–0.5)
Eosinophils Relative: 0 %
HCT: 37.9 % (ref 36.0–46.0)
Hemoglobin: 12.6 g/dL (ref 12.0–15.0)
Immature Granulocytes: 1 %
Lymphocytes Relative: 18 %
Lymphs Abs: 2.8 K/uL (ref 0.7–4.0)
MCH: 25.3 pg — ABNORMAL LOW (ref 26.0–34.0)
MCHC: 33.2 g/dL (ref 30.0–36.0)
MCV: 76.1 fL — ABNORMAL LOW (ref 80.0–100.0)
Monocytes Absolute: 1.3 K/uL — ABNORMAL HIGH (ref 0.1–1.0)
Monocytes Relative: 9 %
Neutro Abs: 11 K/uL — ABNORMAL HIGH (ref 1.7–7.7)
Neutrophils Relative %: 72 %
Platelets: 363 K/uL (ref 150–400)
RBC: 4.98 MIL/uL (ref 3.87–5.11)
RDW: 17.7 % — ABNORMAL HIGH (ref 11.5–15.5)
WBC: 15.3 K/uL — ABNORMAL HIGH (ref 4.0–10.5)
nRBC: 0 % (ref 0.0–0.2)

## 2024-11-05 LAB — I-STAT CG4 LACTIC ACID, ED: Lactic Acid, Venous: 1.4 mmol/L (ref 0.5–1.9)

## 2024-11-05 MED ORDER — ONDANSETRON HCL 4 MG/2ML IJ SOLN
4.0000 mg | Freq: Once | INTRAMUSCULAR | Status: AC
  Administered 2024-11-05: 4 mg via INTRAVENOUS
  Filled 2024-11-05: qty 2

## 2024-11-05 MED ORDER — CLINDAMYCIN PHOSPHATE 300 MG/50ML IV SOLN
300.0000 mg | Freq: Once | INTRAVENOUS | Status: AC
  Administered 2024-11-05: 300 mg via INTRAVENOUS
  Filled 2024-11-05: qty 50

## 2024-11-05 MED ORDER — IOHEXOL 300 MG/ML  SOLN
75.0000 mL | Freq: Once | INTRAMUSCULAR | Status: AC | PRN
  Administered 2024-11-05: 75 mL via INTRAVENOUS

## 2024-11-05 MED ORDER — LACTATED RINGERS IV BOLUS
1000.0000 mL | Freq: Once | INTRAVENOUS | Status: AC
  Administered 2024-11-05: 1000 mL via INTRAVENOUS

## 2024-11-05 MED ORDER — MORPHINE SULFATE (PF) 4 MG/ML IV SOLN
4.0000 mg | Freq: Once | INTRAVENOUS | Status: AC
  Administered 2024-11-05: 4 mg via INTRAVENOUS
  Filled 2024-11-05: qty 1

## 2024-11-05 MED ORDER — OXYCODONE-ACETAMINOPHEN 5-325 MG PO TABS: 1.0000 | ORAL_TABLET | Freq: Four times a day (QID) | ORAL | 0 refills | Status: AC | PRN

## 2024-11-05 MED ORDER — KETOROLAC TROMETHAMINE 15 MG/ML IJ SOLN
15.0000 mg | Freq: Once | INTRAMUSCULAR | Status: AC
  Administered 2024-11-05: 15 mg via INTRAVENOUS
  Filled 2024-11-05: qty 1

## 2024-11-05 MED ORDER — CLINDAMYCIN HCL 150 MG PO CAPS: 150.0000 mg | ORAL_CAPSULE | Freq: Four times a day (QID) | ORAL | 0 refills | Status: AC

## 2024-11-05 NOTE — Discharge Instructions (Addendum)
 You have an infection in your teeth.  We were able to speak with an oral surgeon who is able to see you in their office tomorrow.  You may arrive at their office tomorrow morning and they will work you into their schedule.  The oral surgeons name is Dr. Helga.  In the meantime, you may take clindamycin, 150 mg every 6 hours.  I have also sent you a few narcotic pain pills, Percocet that you may take up to every 6 hours for pain.
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
## 2024-11-05 NOTE — Sepsis Progress Note (Signed)
 Sepsis protocol is being followed by [facility].

## 2024-11-05 NOTE — ED Triage Notes (Signed)
 Pt c/o right facial swelling that started about a week ago. Pt stated she had a tooth pulled (r.lower); thinks tooth didn't get pulled completely.

## 2024-11-05 NOTE — ED Notes (Signed)
 Pt to ct

## 2024-11-10 LAB — CULTURE, BLOOD (ROUTINE X 2): Culture: NO GROWTH
